# Patient Record
Sex: Male | Born: 1995 | Race: White | Hispanic: No | Marital: Single | Smoking: Former smoker
Health system: Southern US, Community
[De-identification: ages and names within clinical notes are randomized; demographics above are authoritative.]

## PROBLEM LIST (undated history)

## (undated) DIAGNOSIS — E669 Obesity, unspecified: Secondary | ICD-10-CM

## (undated) DIAGNOSIS — F419 Anxiety disorder, unspecified: Secondary | ICD-10-CM

## (undated) DIAGNOSIS — F1921 Other psychoactive substance dependence, in remission: Secondary | ICD-10-CM

## (undated) DIAGNOSIS — F32A Depression, unspecified: Secondary | ICD-10-CM

## (undated) DIAGNOSIS — F329 Major depressive disorder, single episode, unspecified: Secondary | ICD-10-CM

## (undated) DIAGNOSIS — G610 Guillain-Barre syndrome: Secondary | ICD-10-CM

## (undated) DIAGNOSIS — R002 Palpitations: Secondary | ICD-10-CM

## (undated) HISTORY — DX: Palpitations: R00.2

## (undated) HISTORY — DX: Depression, unspecified: F32.A

## (undated) HISTORY — DX: Anxiety disorder, unspecified: F41.9

## (undated) HISTORY — PX: HAND SURGERY: SHX662

## (undated) HISTORY — DX: Obesity, unspecified: E66.9

## (undated) HISTORY — DX: Major depressive disorder, single episode, unspecified: F32.9

## (undated) HISTORY — DX: Other psychoactive substance dependence, in remission: F19.21

## (undated) HISTORY — DX: Guillain-Barre syndrome: G61.0

---
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
## 2014-09-22 ENCOUNTER — Other Ambulatory Visit: Payer: Self-pay | Admitting: Family Medicine

## 2014-09-22 DIAGNOSIS — F32A Depression, unspecified: Secondary | ICD-10-CM

## 2014-09-22 DIAGNOSIS — F329 Major depressive disorder, single episode, unspecified: Secondary | ICD-10-CM

## 2014-09-22 DIAGNOSIS — F419 Anxiety disorder, unspecified: Principal | ICD-10-CM

## 2014-09-22 NOTE — Telephone Encounter (Signed)
Refill request was sent to Dr. Edwena Felty for approval and submission.  Propranolol HCl , 1 (one) Tablet two times daily, #60, 30 days starting 07/14/2014, Ref. x3. Active. Associated Diagnosis:Anxiety and depression (300.00, 311  F41.9, F32.9)  Recorded 07/14/2014 04:20 PM by Edwena Felty, MD, Office Visit.  ePrescribed to [phone], 07/14/2014 4:20 PM [id number], [address] [place], [place] [address] [phone]

## 2014-09-22 NOTE — Telephone Encounter (Signed)
Requesting a refill on Propranolol and is requesting a dosage increase. He is currently taking  twice daily and would like to change it to  3 times daily. Requesting this because he has been over taking it, not all the time just sometimes. [facility]ses [facility]. He has only 2pills left. He took one today and will save the other 2 for tomorrow

## 2014-09-23 MED ORDER — PROPRANOLOL HCL 20 MG PO TABS: 20.0000 mg | ORAL_TABLET | Freq: Three times a day (TID) | ORAL | Status: DC

## 2014-11-10 ENCOUNTER — Other Ambulatory Visit: Payer: Self-pay | Admitting: Family Medicine

## 2014-11-24 ENCOUNTER — Other Ambulatory Visit: Payer: Self-pay | Admitting: Family Medicine

## 2015-03-22 ENCOUNTER — Encounter: Payer: Self-pay | Admitting: Family Medicine

## 2015-03-22 ENCOUNTER — Ambulatory Visit
Admission: RE | Admit: 2015-03-22 | Discharge: 2015-03-22 | Disposition: A | Discharge status: Home / Self Care | Payer: BC Managed Care – PPO | Source: Ambulatory Visit | Attending: Family Medicine | Admitting: Family Medicine

## 2015-03-22 ENCOUNTER — Ambulatory Visit (INDEPENDENT_AMBULATORY_CARE_PROVIDER_SITE_OTHER): Payer: BC Managed Care – PPO | Admitting: Family Medicine

## 2015-03-22 VITALS — BP 118/78 | HR 58 | Temp 98.7°F | Resp 16 | Ht 70.0 in | Wt 212.5 lb

## 2015-03-22 DIAGNOSIS — R112 Nausea with vomiting, unspecified: Secondary | ICD-10-CM

## 2015-03-22 DIAGNOSIS — R197 Diarrhea, unspecified: Secondary | ICD-10-CM

## 2015-03-22 DIAGNOSIS — K5900 Constipation, unspecified: Secondary | ICD-10-CM | POA: Diagnosis not present

## 2015-03-22 DIAGNOSIS — Z113 Encounter for screening for infections with a predominantly sexual mode of transmission: Secondary | ICD-10-CM | POA: Diagnosis not present

## 2015-03-22 DIAGNOSIS — R1084 Generalized abdominal pain: Secondary | ICD-10-CM | POA: Diagnosis not present

## 2015-03-22 MED ORDER — ONDANSETRON 8 MG PO TBDP: 8.0000 mg | ORAL_TABLET | Freq: Three times a day (TID) | ORAL | Status: DC | PRN

## 2015-03-22 MED ORDER — OMEPRAZOLE 40 MG PO CPDR: 40.0000 mg | DELAYED_RELEASE_CAPSULE | Freq: Every day | ORAL | Status: DC

## 2015-03-22 NOTE — Patient Instructions (Signed)
1) Call Dr. Sherley Bounds if not on the way to getting better in 2 weeks then she will order stool tests.

## 2015-03-22 NOTE — Progress Notes (Signed)
Name: Jacob Peters   MRN: [id number]    DOB: 1995-10-25   Date:03/22/2015       Progress Note  Subjective  Chief Complaint  Chief Complaint  Patient presents with  . Abdominal Pain    onset 2 weeks, loss of appetite, nausea, 1-2 episodes of vomiting and dirrhea    HPI  Jacob Peters is a 20 year old male here today with complaints of 2 weeks of generalized vague abdominal discomfort, nausea, vomiting, diarrhea, loss of appetite. No fevers, chills, rash, blood with stools. Denies recent foreign travel. Does eat out however no one else in his household has similar symptoms. He notes that the diarrhea is improved.   Patient Active Problem List   Diagnosis Date Noted  . Awareness of heartbeats 06/09/2013  . Anxiety 03/26/2013  . Obsessional thoughts 03/26/2013  . Episodic paroxysmal anxiety disorder 03/26/2013    Social History  Substance Use Topics  . Smoking status: Current Every Day Smoker  . Smokeless tobacco: Never Used  . Alcohol Use: No     Current outpatient prescriptions:  .  escitalopram (LEXAPRO) 10 MG tablet, TAKE ONE TABLET BY MOUTH ONCE DAILY, Disp: 30 tablet, Rfl: 5 .  propranolol (INDERAL) 20 MG tablet, Take 1 tablet (20 mg total) by mouth 3 (three) times daily., Disp: 90 tablet, Rfl: 5  No Known Allergies  Review of Systems  Positive for abdominal pain, nausea, vomiting, diarrha as mentioned in HPI, otherwise all systems reviewed and are negative.  Objective  BP 118/78 mmHg  Pulse 58  Temp(Src) 98.7 F (37.1 C) (Oral)  Resp 16  Ht  (1.778 m)  Wt 212 lb 8 oz (96.389 kg)  BMI 30.49 kg/m2  SpO2 98%  Body mass index is 30.49 kg/(m^2).   Physical Exam  Constitutional: Patient appears well-developed and well-nourished. In no distress.  Cardiovascular: Normal rate, regular rhythm and normal heart sounds.  No murmur heard.  Pulmonary/Chest: Effort normal and breath sounds normal. No respiratory distress. Abdomen: Soft, non distended, no guarding  or rebound. Mildly increased bowel sounds generalized. No HSM.  Musculoskeletal: Normal range of motion bilateral UE and LE, no joint effusions. Peripheral vascular: Bilateral LE no edema. Neurological: CN II-XII grossly intact with no focal deficits. Alert and oriented to person, place, and time. Coordination, balance, strength, speech and gait are normal.  Skin: Skin is warm and dry. No rash noted. No erythema.  Psychiatric: Patient has a normal mood and affect. Behavior is normal in office today. Judgment and thought content normal in office today.    Assessment & Plan  1. Generalized abdominal pain Likely viral gastroenteritis although symptoms lasting 2 weeks now. I will get abdominal x-ray and place Pride on PPI for [redacted] weeks along with prn nausea medications. Instructed on modified bland diet advance as tolerated.   - CBC with Differential/Platelet - Comprehensive metabolic panel - Hepatitis A antibody, total - Hepatitis B surface antibody - Hepatitis C antibody - HIV antibody - Amylase - Lipase - DG Abd 2 Views; Future - ondansetron (ZOFRAN-ODT) 8 MG disintegrating tablet; Take 1 tablet (8 mg total) by mouth every 8 (eight) hours as needed for nausea or vomiting.  Dispense: 30 tablet; Refill: 1 - omeprazole (PRILOSEC) 40 MG capsule; Take 1 capsule (40 mg total) by mouth daily.  Dispense: 14 capsule; Refill: 0  2. Nausea vomiting and diarrhea In symptoms persist another 2 weeks   - CBC with Differential/Platelet - Comprehensive metabolic panel - Hepatitis A antibody, total -  Hepatitis B surface antibody - Hepatitis C antibody - HIV antibody - Amylase - Lipase - DG Abd 2 Views; Future - ondansetron (ZOFRAN-ODT) 8 MG disintegrating tablet; Take 1 tablet (8 mg total) by mouth every 8 (eight) hours as needed for nausea or vomiting.  Dispense: 30 tablet; Refill: 1 - omeprazole (PRILOSEC) 40 MG capsule; Take 1 capsule (40 mg total) by mouth daily.  Dispense: 14 capsule; Refill:  0  3. Screening for STD (sexually transmitted disease) Viral hepatitis possibility.  - CBC with Differential/Platelet - Comprehensive metabolic panel - Hepatitis A antibody, total - Hepatitis B surface antibody - Hepatitis C antibody - HIV antibody - Amylase - Lipase - DG Abd 2 Views; Future

## 2015-03-23 LAB — CBC WITH DIFFERENTIAL/PLATELET
Basophils Absolute: 0 10*3/uL (ref 0.0–0.2)
Basos: 0 %
EOS (ABSOLUTE): 0.1 10*3/uL (ref 0.0–0.4)
Eos: 2 %
HEMATOCRIT: 44.5 % (ref 37.5–51.0)
Hemoglobin: 15.6 g/dL (ref 12.6–17.7)
IMMATURE GRANULOCYTES: 0 %
Immature Grans (Abs): 0 10*3/uL (ref 0.0–0.1)
LYMPHS: 45 %
Lymphocytes Absolute: 2.7 10*3/uL (ref 0.7–3.1)
MCH: 28.9 pg (ref 26.6–33.0)
MCHC: 35.1 g/dL (ref 31.5–35.7)
MCV: 82 fL (ref 79–97)
MONOCYTES: 9 %
Monocytes Absolute: 0.5 10*3/uL (ref 0.1–0.9)
NEUTROS ABS: 2.7 10*3/uL (ref 1.4–7.0)
NEUTROS PCT: 44 %
PLATELETS: 344 10*3/uL (ref 150–379)
RBC: 5.4 x10E6/uL (ref 4.14–5.80)
RDW: 13.3 % (ref 12.3–15.4)
WBC: 6.1 10*3/uL (ref 3.4–10.8)

## 2015-03-23 LAB — COMPREHENSIVE METABOLIC PANEL
A/G RATIO: 2.1 (ref 1.1–2.5)
ALBUMIN: 4.5 g/dL (ref 3.5–5.5)
ALT: 19 IU/L (ref 0–44)
AST: 20 IU/L (ref 0–40)
Alkaline Phosphatase: 66 IU/L (ref 39–117)
BUN / CREAT RATIO: 7 — AB (ref 8–19)
BUN: 6 mg/dL (ref 6–20)
Bilirubin Total: 0.7 mg/dL (ref 0.0–1.2)
CO2: 28 mmol/L (ref 18–29)
Calcium: 9.4 mg/dL (ref 8.7–10.2)
Chloride: 105 mmol/L (ref 96–106)
Creatinine, Ser: 0.92 mg/dL (ref 0.76–1.27)
GFR, EST AFRICAN AMERICAN: 138 mL/min/{1.73_m2} (ref >59)
GFR, EST NON AFRICAN AMERICAN: 119 mL/min/{1.73_m2} (ref >59)
Globulin, Total: 2.1 g/dL (ref 1.5–4.5)
Glucose: 83 mg/dL (ref 65–99)
POTASSIUM: 4.4 mmol/L (ref 3.5–5.2)
Sodium: 145 mmol/L — ABNORMAL HIGH (ref 134–144)
TOTAL PROTEIN: 6.6 g/dL (ref 6.0–8.5)

## 2015-03-23 LAB — HEPATITIS A ANTIBODY, TOTAL: HEP A TOTAL AB: NEGATIVE

## 2015-03-23 LAB — HEPATITIS B SURFACE ANTIBODY,QUALITATIVE: Hep B Surface Ab, Qual: REACTIVE

## 2015-03-23 LAB — LIPASE: LIPASE: 23 U/L (ref 0–59)

## 2015-03-23 LAB — AMYLASE: Amylase: 40 U/L (ref 31–124)

## 2015-03-23 LAB — HIV ANTIBODY (ROUTINE TESTING W REFLEX): HIV SCREEN 4TH GENERATION: NONREACTIVE

## 2015-03-23 LAB — HEPATITIS C ANTIBODY

## 2015-04-09 ENCOUNTER — Other Ambulatory Visit: Payer: Self-pay | Admitting: Family Medicine

## 2015-06-08 ENCOUNTER — Other Ambulatory Visit: Payer: Self-pay

## 2015-06-08 MED ORDER — ESCITALOPRAM OXALATE 10 MG PO TABS: 10.0000 mg | ORAL_TABLET | Freq: Every day | ORAL | Status: DC

## 2015-06-08 NOTE — Telephone Encounter (Signed)
Last rx reviewed; rx filled

## 2015-08-15 ENCOUNTER — Other Ambulatory Visit: Payer: Self-pay | Admitting: Family Medicine

## 2015-09-14 ENCOUNTER — Other Ambulatory Visit: Payer: Self-pay | Admitting: Family Medicine

## 2015-09-14 NOTE — Telephone Encounter (Signed)
I approved one month of medicine in June with note asking for appointment please; I do not see any appointment scheduled I'll approve one week while we contact patient and get him scheduled to be seen

## 2015-09-15 NOTE — Telephone Encounter (Signed)
LM for pt to call office to schedule an appt

## 2015-09-15 NOTE — Telephone Encounter (Signed)
L/M for pt to call the office to schedule an appt

## 2015-09-16 ENCOUNTER — Other Ambulatory Visit: Payer: Self-pay

## 2015-09-16 MED ORDER — PROPRANOLOL HCL 20 MG PO TABS: 20.0000 mg | ORAL_TABLET | Freq: Three times a day (TID) | ORAL | Status: DC

## 2015-09-16 NOTE — Telephone Encounter (Signed)
Limited Rx sent; last pulse was less than 60; may need to adjust medicine; he has appt this week and will discuss then

## 2015-09-17 ENCOUNTER — Encounter: Payer: Self-pay | Admitting: Family Medicine

## 2015-09-17 ENCOUNTER — Ambulatory Visit (INDEPENDENT_AMBULATORY_CARE_PROVIDER_SITE_OTHER): Payer: BC Managed Care – PPO | Admitting: Family Medicine

## 2015-09-17 VITALS — BP 122/84 | HR 87 | Temp 97.8°F | Resp 14 | Wt 229.0 lb

## 2015-09-17 DIAGNOSIS — M545 Low back pain, unspecified: Secondary | ICD-10-CM

## 2015-09-17 DIAGNOSIS — E669 Obesity, unspecified: Secondary | ICD-10-CM

## 2015-09-17 DIAGNOSIS — F419 Anxiety disorder, unspecified: Secondary | ICD-10-CM

## 2015-09-17 DIAGNOSIS — R635 Abnormal weight gain: Secondary | ICD-10-CM

## 2015-09-17 DIAGNOSIS — F1921 Other psychoactive substance dependence, in remission: Secondary | ICD-10-CM | POA: Diagnosis not present

## 2015-09-17 DIAGNOSIS — Z0000 Encounter for general adult medical examination without abnormal findings: Secondary | ICD-10-CM

## 2015-09-17 DIAGNOSIS — M79672 Pain in left foot: Secondary | ICD-10-CM | POA: Diagnosis not present

## 2015-09-17 DIAGNOSIS — R002 Palpitations: Secondary | ICD-10-CM

## 2015-09-17 HISTORY — DX: Obesity, unspecified: E66.9

## 2015-09-17 HISTORY — DX: Other psychoactive substance dependence, in remission: F19.21

## 2015-09-17 LAB — CBC WITH DIFFERENTIAL/PLATELET
BASOS PCT: 0 %
Basophils Absolute: 0 cells/uL (ref 0–200)
EOS PCT: 2 %
Eosinophils Absolute: 160 cells/uL (ref 15–500)
HCT: 47 % (ref 38.5–50.0)
HEMOGLOBIN: 16.2 g/dL (ref 13.2–17.1)
LYMPHS ABS: 3040 {cells}/uL (ref 850–3900)
Lymphocytes Relative: 38 %
MCH: 29.1 pg (ref 27.0–33.0)
MCHC: 34.5 g/dL (ref 32.0–36.0)
MCV: 84.5 fL (ref 80.0–100.0)
MPV: 9.7 fL (ref 7.5–12.5)
Monocytes Absolute: 880 cells/uL (ref 200–950)
Monocytes Relative: 11 %
NEUTROS ABS: 3920 {cells}/uL (ref 1500–7800)
Neutrophils Relative %: 49 %
Platelets: 291 10*3/uL (ref 140–400)
RBC: 5.56 MIL/uL (ref 4.20–5.80)
RDW: 13.3 % (ref 11.0–15.0)
WBC: 8 10*3/uL (ref 3.8–10.8)

## 2015-09-17 LAB — TSH: TSH: 1.26 m[IU]/L (ref 0.40–4.50)

## 2015-09-17 LAB — T4, FREE: FREE T4: 1 ng/dL (ref 0.8–1.8)

## 2015-09-17 MED ORDER — PROPRANOLOL HCL 20 MG PO TABS: 20.0000 mg | ORAL_TABLET | Freq: Three times a day (TID) | ORAL | Status: DC

## 2015-09-17 MED ORDER — MELOXICAM 7.5 MG PO TABS: 7.5000 mg | ORAL_TABLET | Freq: Every day | ORAL | Status: DC

## 2015-09-17 MED ORDER — ESCITALOPRAM OXALATE 10 MG PO TABS: 10.0000 mg | ORAL_TABLET | Freq: Every day | ORAL | Status: DC

## 2015-09-17 NOTE — Assessment & Plan Note (Signed)
Will avoid benzos given his history of addiction; so glad he felt open to share that with me; will use the SSRI and beta-blocker to treat his symptoms

## 2015-09-17 NOTE — Assessment & Plan Note (Signed)
Will use the meloxicam for this as well; will spare him the radiation of an xray today; may set up with arthritis in the future though; try turmeric as natural anti-inflammatory

## 2015-09-17 NOTE — Assessment & Plan Note (Signed)
Patient has put on significant weight over the last few years; check TSH and free T4; see AVS for recommendations for weight loss; encouraged him to not skip meals and to avoid empty calories

## 2015-09-17 NOTE — Progress Notes (Signed)
BP 122/84 mmHg  Pulse 87  Temp(Src) 97.8 F (36.6 C) (Oral)  Resp 14  Wt 229 lb (103.874 kg)  SpO2 97%   Subjective:    Patient ID: Jacob Peters, male    DOB: 02/05/1996, 20 y.o.   MRN: [id number]  HPI: Jacob Peters is a 20 y.o. male  Chief Complaint  Patient presents with  . Medication Refill  . Back Pain    lower back   He has anxiety; has been on beta-blocker and SSRI for a few years; used to take clonazepam but had a problem with that in college, was drinking and taking benzo; 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 and is now off of those and does not drink any alcohol He focuses on his heart rate; can go up and down; it has been checked out; has palpitations too; inderal controls that pretty well; resting heart around 60; inderal really helps it; does make him a little tired and groggy  He hurt his back on the right side; thinks his shoes aren't really great; was pushing 150 pounds the other day, now hurting in the lower back at the very bottom; also running up the right side too; pretty significant pain; laying down feels the best; still has dull pain; hurts with twisting; tried ibuprofen and icy hot an ice and heat; no weakness or numbness going down the leg; had back problems through middle school, occasional soreness with sports; once had a upper back issue with sports in middle school, got better with exercises No B/B dysfunction  He hurt his 1st MTP left foot 3 months ago; swelled up, pain with walking; pain better, but still with limited ROM and swelling medially  Weighed 165 pounds as a [employer]; no thyroid disease; gaining weight; 17 pounds up since January, 60+ pounds over last 3-4 years; no alcohol calories, but drinking sodas; work, waiting tables, up and moving at work, but not very active when gets home  Depression screen [facility] 2/9 09/17/2015 03/22/2015  Decreased Interest 0 0  Down, Depressed, Hopeless 0 1  PHQ - 2 Score 0 1   Relevant past medical, surgical, family and social  history reviewed Past Medical History  Diagnosis Date  . Palpitations   . Anxiety   . 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 Obesity 09/17/2015   History reviewed. No pertinent past surgical history.   Family History  Problem Relation Age of Onset  . Anxiety disorder Mother   . Hypertension Father    Social History  Substance Use Topics  . Smoking status: Current Every Day Smoker  . Smokeless tobacco: Never Used  . Alcohol Use: No   Interim medical history since last visit reviewed. Allergies and medications reviewed  Review of Systems Per HPI unless specifically indicated above     Objective:    BP 122/84 mmHg  Pulse 87  Temp(Src) 97.8 F (36.6 C) (Oral)  Resp 14  Wt 229 lb (103.874 kg)  SpO2 97%  Wt Readings from Last 3 Encounters:  09/17/15 229 lb (103.874 kg)  03/22/15 212 lb 8 oz (96.389 kg)   body mass index is 32.86 kg/(m^2).  Physical Exam  Constitutional: He appears well-developed and well-nourished. No distress.  Weight gain 16-1/2 pounds over last 6 months  HENT:  Head: Normocephalic and atraumatic.  Eyes: EOM are normal. No scleral icterus.  Neck: No thyromegaly present.  Cardiovascular:  Normal rate, regular rhythm and normal heart sounds.   No murmur heard. Pulmonary/Chest: Effort normal and breath sounds normal.  Abdominal: Soft. Bowel sounds are normal. He exhibits no distension.  Musculoskeletal: He exhibits no edema.       Lumbar back: He exhibits decreased range of motion (forward flexion limited to about 60 degrees; extension at waist normal; rotation causes right sided lower back pain). He exhibits no swelling, no edema (mild right sided lumbar edema, no erythema) and no spasm.       Left foot: There is decreased range of motion (left 1st MTP) and swelling (medial aspect 1st MTP). There is no crepitus and no deformity.  Neurological:  Coordination normal.  Skin: Skin is warm and dry. No pallor.  Psychiatric: His behavior is normal. Judgment and thought content normal. His mood appears anxious (mildly anxious). His affect is not blunt, not labile and not inappropriate. His speech is not rapid and/or pressured. He is not hyperactive. Cognition and memory are not impaired. He does not exhibit a depressed mood. He is attentive.   Results for orders placed or performed in visit on 03/22/15  CBC with Differential/Platelet  Result Value Ref Range   WBC 6.1 3.4 - 10.8 x10E3/uL   RBC 5.40 4.14 - 5.80 x10E6/uL   Hemoglobin 15.6 12.6 - 17.7 g/dL   Hematocrit 47.844.5 29.537.5 - 51.0 %   MCV 82 79 - 97 fL   MCH 28.9 26.6 - 33.0 pg   MCHC 35.1 31.5 - 35.7 g/dL   RDW 62.113.3 30.812.3 - 65.715.4 %   Platelets 344 150 - 379 x10E3/uL   Neutrophils 44 %   Lymphs 45 %   Monocytes 9 %   Eos 2 %   Basos 0 %   Neutrophils Absolute 2.7 1.4 - 7.0 x10E3/uL   Lymphocytes Absolute 2.7 0.7 - 3.1 x10E3/uL   Monocytes Absolute 0.5 0.1 - 0.9 x10E3/uL   EOS (ABSOLUTE) 0.1 0.0 - 0.4 x10E3/uL   Basophils Absolute 0.0 0.0 - 0.2 x10E3/uL   Immature Granulocytes 0 %   Immature Grans (Abs) 0.0 0.0 - 0.1 x10E3/uL  Comprehensive metabolic panel  Result Value Ref Range   Glucose 83 65 - 99 mg/dL   BUN 6 6 - 20 mg/dL   Creatinine, Ser 8.460.92 0.76 - 1.27 mg/dL   GFR calc non Af Amer 119 >59 mL/min/1.73   GFR calc Af Amer 138 >59 mL/min/1.73   BUN/Creatinine Ratio 7 (L) 8 - 19   Sodium 145 (H) 134 - 144 mmol/L   Potassium 4.4 3.5 - 5.2 mmol/L   Chloride 105 96 - 106 mmol/L   CO2 28 18 - 29 mmol/L   Calcium 9.4 8.7 - 10.2 mg/dL   Total Protein 6.6 6.0 - 8.5 g/dL   Albumin 4.5 3.5 - 5.5 g/dL   Globulin, Total 2.1 1.5 - 4.5 g/dL   Albumin/Globulin Ratio 2.1 1.1 - 2.5   Bilirubin Total 0.7 0.0 - 1.2 mg/dL   Alkaline Phosphatase 66 39 - 117 IU/L   AST 20 0 - 40 IU/L   ALT 19 0 - 44 IU/L  Hepatitis A antibody, total  Result Value Ref Range   Hep A Total Ab Negative  Negative  Hepatitis B surface antibody  Result Value Ref Range   Hep B Surface Ab, Qual Reactive   Hepatitis C antibody  Result Value Ref Range   Hep C Virus Ab <0.1 0.0 - 0.9 s/co ratio  HIV antibody  Result Value Ref Range  HIV Screen 4th Generation wRfx Non Reactive Non Reactive  Amylase  Result Value Ref Range   Amylase 40 31 - 124 U/L  Lipase  Result Value Ref Range   Lipase 23 0 - 59 U/L      Assessment & Plan:   Problem List Items Addressed This Visit      Other   Weight gain, abnormal    Patient has put on significant weight over the last few years; check TSH and free T4; see AVS for recommendations for weight loss; encouraged him to not skip meals and to avoid empty calories      Relevant Orders   TSH   T4, free   Preventative health care    Will check screening lipids (nonfasting), cbc, cmet      Relevant Orders   CBC with Differential/Platelet   Comprehensive metabolic panel   Lipid panel   Obesity    With abnormal weight gain; see AVS; encouraged not skipping meals; avoiding soft drinks, check TSH to see if thyroid issue      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)    Hx of benzo addiction, combined with alcohol; 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; now in remission; so glad patient shared that with me; I will not give him anything that could set off his addiction such as narcotic pain medicine, benzo, or muscle relaxant      Foot pain, left    Will use the meloxicam for this as well; will spare him the radiation of an xray today; may set up with arthritis in the future though; try turmeric as natural anti-inflammatory      Awareness of heartbeats    Patient reports heart has been checked out; will continue the propranolol as he reports this helps with his symptoms; will also check K+      Anxiety    Will avoid benzos given his history of addiction; so glad he felt open to share that with me; will use the SSRI and beta-blocker to treat his symptoms      Relevant  Medications   escitalopram (LEXAPRO) 10 MG tablet    Other Visit Diagnoses    Right-sided low back pain without sciatica    -  Primary    Relevant Medications    meloxicam (MOBIC) 7.5 MG tablet        Follow up plan: Return in about 1 year (around 09/16/2016) for follow-up.  An after-visit summary was printed and given to the patient at check-out.  Please see the patient instructions which may contain other information and recommendations beyond what is mentioned above in the assessment and plan.  Meds ordered this encounter  Medications  . propranolol (INDERAL) 20 MG tablet    Sig: Take 1 tablet (20 mg total) by mouth 3 (three) times daily.    Dispense:  90 tablet    Refill:  11  . escitalopram (LEXAPRO) 10 MG tablet    Sig: Take 1 tablet (10 mg total) by mouth daily.    Dispense:  30 tablet    Refill:  11  . meloxicam (MOBIC) 7.5 MG tablet    Sig: Take 1 tablet (7.5 mg total) by mouth daily. If needed for back pain; do not take other NSAIDs; take with food    Dispense:  30 tablet    Refill:  0    Orders Placed This Encounter  Procedures  . CBC with Differential/Platelet  . Comprehensive metabolic panel  . TSH  . T4, free  .  Lipid panel

## 2015-09-17 NOTE — Assessment & Plan Note (Signed)
With abnormal weight gain; see AVS; encouraged not skipping meals; avoiding soft drinks, check TSH to see if thyroid issue

## 2015-09-17 NOTE — Assessment & Plan Note (Signed)
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

## 2015-09-17 NOTE — Patient Instructions (Addendum)
Try turmeric as a natural anti-inflammatory (for pain and arthritis). It comes in capsules where you buy aspirin and fish oil, but also as a spice where you buy pepper and garlic powder.  Start meloxicam and take one a day if needed for back and/or foot pain and swelling Do not use other NSAIDs while on the meloxicam You can use generic Aleve 220 mg AFTER you finish the meloxicam Okay to use tylenol per package directions with either meloxicam or Aleve  We'll get labs today If you have not heard anything from my staff in a week about any orders/referrals/studies from today, please contact us here to follow-up [phone]  Check out the information at [facility]org entitled "Nutrition for Weight Loss: What You Need to Know about Fad Diets" Try to lose between 1-2 pounds per week by taking in fewer calories and burning off more calories You can succeed by limiting portions, limiting foods dense in calories and fat, becoming more active, and drinking 8 glasses of water a day (64 ounces) Don't skip meals, especially breakfast, as skipping meals may alter your metabolism Do not use over-the-counter weight loss pills or gimmicks that claim rapid weight loss A healthy BMI (or body mass index) is between 18.5 and 24.9 You can calculate your ideal BMI at the NIH website [employer]  .Back Pain, Adult Back pain is very common in adults.The cause of back pain is rarely dangerous and the pain often gets better over time.The cause of your back pain may not be known. Some common causes of back pain include: 1. Strain of the muscles or ligaments supporting the spine. 2. Wear and tear (degeneration) of the spinal disks. 3. Arthritis. 4. Direct injury to the back. For many people, back pain may return. Since back pain is rarely dangerous, most people can learn to manage this condition on their own. HOME CARE INSTRUCTIONS Watch your back pain  for any changes. The following actions may help to lessen any discomfort you are feeling: 1. Remain active. It is stressful on your back to sit or stand in one place for long periods of time. Do not sit, drive, or stand in one place for more than 30 minutes at a time. Take short walks on even surfaces as soon as you are able.Try to increase the length of time you walk each day. 2. Exercise regularly as directed by your health care provider. Exercise helps your back heal faster. It also helps avoid future injury by keeping your muscles strong and flexible. 3. Do not stay in bed.Resting more than 1-2 days can delay your recovery. 4. Pay attention to your body when you bend and lift. The most comfortable positions are those that put less stress on your recovering back. Always use proper lifting techniques, including: 1. Bending your knees. 2. Keeping the load close to your body. 3. Avoiding twisting. 5. Find a comfortable position to sleep. Use a firm mattress and lie on your side with your knees slightly bent. If you lie on your back, put a pillow under your knees. 6. Avoid feeling anxious or stressed.Stress increases muscle tension and can worsen back pain.It is important to recognize when you are anxious or stressed and learn ways to manage it, such as with exercise. 7. Take medicines only as directed by your health care provider. Over-the-counter medicines to reduce pain and inflammation are often the most helpful.Your health care provider may prescribe muscle relaxant drugs.These medicines help dull your pain so you can more quickly return  to your normal activities and healthy exercise. 8. Apply ice to the injured area: 1. Put ice in a plastic bag. 2. Place a towel between your skin and the bag. 3. Leave the ice on for 20 minutes, 2-3 times a day for the first 2-3 days. After that, ice and heat may be alternated to reduce pain and spasms. 9. Maintain a healthy weight. Excess weight puts extra  stress on your back and makes it difficult to maintain good posture. SEEK MEDICAL CARE IF: 1. You have pain that is not relieved with rest or medicine. 2. You have increasing pain going down into the legs or buttocks. 3. You have pain that does not improve in one week. 4. You have night pain. 5. You lose weight. 6. You have a fever or chills. SEEK IMMEDIATE MEDICAL CARE IF:  1. You develop new bowel or bladder control problems. 2. You have unusual weakness or numbness in your arms or legs. 3. You develop nausea or vomiting. 4. You develop abdominal pain. 5. You feel faint.   This information is not intended to replace advice given to you by your health care provider. Make sure you discuss any questions you have with your health care provider.   Document Released: 02/13/2005 Document Revised: 03/06/2014 Document Reviewed: 06/17/2013 Elsevier Interactive Patient Education 2016 [employer] Back Exercises The following exercises strengthen the muscles that help to support the back. They also help to keep the lower back flexible. Doing these exercises can help to prevent back pain or lessen existing pain. If you have back pain or discomfort, try doing these exercises 2-3 times each day or as told by your health care provider. When the pain goes away, do them once each day, but increase the number of times that you repeat the steps for each exercise (do more repetitions). If you do not have back pain or discomfort, do these exercises once each day or as told by your health care provider. EXERCISES Single Knee to Chest Repeat these steps 3-5 times for each leg: 5. Lie on your back on a firm bed or the floor with your legs extended. 6. Bring one knee to your chest. Your other leg should stay extended and in contact with the floor. 7. Hold your knee in place by grabbing your knee or thigh. 8. Pull on your knee until you feel a gentle stretch in your lower back. 9. Hold the stretch for 10-30  seconds. 10. Slowly release and straighten your leg. Pelvic Tilt Repeat these steps 5-10 times: 10. Lie on your back on a firm bed or the floor with your legs extended. 11. Bend your knees so they are pointing toward the ceiling and your feet are flat on the floor. 12. Tighten your lower abdominal muscles to press your lower back against the floor. This motion will tilt your pelvis so your tailbone points up toward the ceiling instead of pointing to your feet or the floor. 13. With gentle tension and even breathing, hold this position for 5-10 seconds. Cat-Cow Repeat these steps until your lower back becomes more flexible: 7. Get into a hands-and-knees position on a firm surface. Keep your hands under your shoulders, and keep your knees under your hips. You may place padding under your knees for comfort. 8. Let your head hang down, and point your tailbone toward the floor so your lower back becomes rounded like the back of a cat. 9. Hold this position for 5 seconds. 10. Slowly lift your head  and point your tailbone up toward the ceiling so your back forms a sagging arch like the back of a cow. 11. Hold this position for 5 seconds. Press-Ups Repeat these steps 5-10 times: 6. Lie on your abdomen (face-down) on the floor. 7. Place your palms near your head, about shoulder-width apart. 8. While you keep your back as relaxed as possible and keep your hips on the floor, slowly straighten your arms to raise the top half of your body and lift your shoulders. Do not use your back muscles to raise your upper torso. You may adjust the placement of your hands to make yourself more comfortable. 9. Hold this position for 5 seconds while you keep your back relaxed. 10. Slowly return to lying flat on the floor. Bridges Repeat these steps 10 times: 1. Lie on your back on a firm surface. 2. Bend your knees so they are pointing toward the ceiling and your feet are flat on the floor. 3. Tighten your buttocks  muscles and lift your buttocks off of the floor until your waist is at almost the same height as your knees. You should feel the muscles working in your buttocks and the back of your thighs. If you do not feel these muscles, slide your feet 1-2 inches farther away from your buttocks. 4. Hold this position for 3-5 seconds. 5. Slowly lower your hips to the starting position, and allow your buttocks muscles to relax completely. If this exercise is too easy, try doing it with your arms crossed over your chest. Abdominal Crunches Repeat these steps 5-10 times: 1. Lie on your back on a firm bed or the floor with your legs extended. 2. Bend your knees so they are pointing toward the ceiling and your feet are flat on the floor. 3. Cross your arms over your chest. 4. Tip your chin slightly toward your chest without bending your neck. 5. Tighten your abdominal muscles and slowly raise your trunk (torso) high enough to lift your shoulder blades a tiny bit off of the floor. Avoid raising your torso higher than that, because it can put too much stress on your low back and it does not help to strengthen your abdominal muscles. 6. Slowly return to your starting position. Back Lifts Repeat these steps 5-10 times: 1. Lie on your abdomen (face-down) with your arms at your sides, and rest your forehead on the floor. 2. Tighten the muscles in your legs and your buttocks. 3. Slowly lift your chest off of the floor while you keep your hips pressed to the floor. Keep the back of your head in line with the curve in your back. Your eyes should be looking at the floor. 4. Hold this position for 3-5 seconds. 5. Slowly return to your starting position. SEEK MEDICAL CARE IF:  Your back pain or discomfort gets much worse when you do an exercise.  Your back pain or discomfort does not lessen within 2 hours after you exercise. If you have any of these problems, stop doing these exercises right away. Do not do them again  unless your health care provider says that you can. SEEK IMMEDIATE MEDICAL CARE IF:  You develop sudden, severe back pain. If this happens, stop doing the exercises right away. Do not do them again unless your health care provider says that you can.   This information is not intended to replace advice given to you by your health care provider. Make sure you discuss any questions you have with your health  care provider.   Document Released: 03/23/2004 Document Revised: 11/04/2014 Document Reviewed: 04/09/2014 Elsevier Interactive Patient Education [employer]

## 2015-09-17 NOTE — Assessment & Plan Note (Signed)
Will check screening lipids (nonfasting), cbc, cmet

## 2015-09-17 NOTE — Assessment & Plan Note (Signed)
Patient reports heart has been checked out; will continue the propranolol as he reports this helps with his symptoms; will also check K+

## 2015-09-18 LAB — LIPID PANEL
CHOL/HDL RATIO: 3.8 ratio (ref <=5.0)
Cholesterol: 150 mg/dL (ref 125–200)
HDL: 40 mg/dL (ref >=40)
LDL CALC: 92 mg/dL (ref <130)
Triglycerides: 91 mg/dL (ref <150)
VLDL: 18 mg/dL (ref <30)

## 2015-09-18 LAB — COMPREHENSIVE METABOLIC PANEL
ALBUMIN: 4.4 g/dL (ref 3.6–5.1)
ALT: 16 U/L (ref 9–46)
AST: 19 U/L (ref 10–40)
Alkaline Phosphatase: 55 U/L (ref 40–115)
BILIRUBIN TOTAL: 0.7 mg/dL (ref 0.2–1.2)
BUN: 13 mg/dL (ref 7–25)
CO2: 27 mmol/L (ref 20–31)
CREATININE: 0.98 mg/dL (ref 0.60–1.35)
Calcium: 9.3 mg/dL (ref 8.6–10.3)
Chloride: 103 mmol/L (ref 98–110)
Glucose, Bld: 81 mg/dL (ref 65–99)
Potassium: 4.3 mmol/L (ref 3.5–5.3)
SODIUM: 141 mmol/L (ref 135–146)
TOTAL PROTEIN: 6.8 g/dL (ref 6.1–8.1)

## 2015-10-28 ENCOUNTER — Encounter: Payer: Self-pay | Admitting: Medical Oncology

## 2015-10-28 ENCOUNTER — Emergency Department
Admission: EM | Admit: 2015-10-28 | Discharge: 2015-10-28 | Disposition: A | Discharge status: Home / Self Care | Payer: BC Managed Care – PPO | Attending: Emergency Medicine | Admitting: Emergency Medicine

## 2015-10-28 DIAGNOSIS — A084 Viral intestinal infection, unspecified: Secondary | ICD-10-CM | POA: Diagnosis not present

## 2015-10-28 DIAGNOSIS — R197 Diarrhea, unspecified: Secondary | ICD-10-CM

## 2015-10-28 DIAGNOSIS — F17200 Nicotine dependence, unspecified, uncomplicated: Secondary | ICD-10-CM | POA: Insufficient documentation

## 2015-10-28 DIAGNOSIS — R112 Nausea with vomiting, unspecified: Secondary | ICD-10-CM

## 2015-10-28 LAB — CBC
HCT: 50.4 % (ref 40.0–52.0)
HEMOGLOBIN: 17.6 g/dL (ref 13.0–18.0)
MCH: 29.6 pg (ref 26.0–34.0)
MCHC: 34.9 g/dL (ref 32.0–36.0)
MCV: 84.7 fL (ref 80.0–100.0)
Platelets: 245 10*3/uL (ref 150–440)
RBC: 5.95 MIL/uL — ABNORMAL HIGH (ref 4.40–5.90)
RDW: 13.5 % (ref 11.5–14.5)
WBC: 13.3 10*3/uL — ABNORMAL HIGH (ref 3.8–10.6)

## 2015-10-28 LAB — URINALYSIS COMPLETE WITH MICROSCOPIC (ARMC ONLY)
BACTERIA UA: NONE SEEN
Bilirubin Urine: NEGATIVE
GLUCOSE, UA: NEGATIVE mg/dL
Hgb urine dipstick: NEGATIVE
KETONES UR: NEGATIVE mg/dL
Leukocytes, UA: NEGATIVE
NITRITE: NEGATIVE
PROTEIN: NEGATIVE mg/dL
Specific Gravity, Urine: 1.021 (ref 1.005–1.030)
pH: 5 (ref 5.0–8.0)

## 2015-10-28 LAB — COMPREHENSIVE METABOLIC PANEL
ALBUMIN: 4.5 g/dL (ref 3.5–5.0)
ALK PHOS: 53 U/L (ref 38–126)
ALT: 18 U/L (ref 17–63)
ANION GAP: 10 (ref 5–15)
AST: 32 U/L (ref 15–41)
BILIRUBIN TOTAL: 1.9 mg/dL — AB (ref 0.3–1.2)
BUN: 13 mg/dL (ref 6–20)
CALCIUM: 9.1 mg/dL (ref 8.9–10.3)
CO2: 22 mmol/L (ref 22–32)
Chloride: 104 mmol/L (ref 101–111)
Creatinine, Ser: 0.95 mg/dL (ref 0.61–1.24)
GFR calc Af Amer: >60 mL/min (ref >60)
GFR calc non Af Amer: >60 mL/min (ref >60)
GLUCOSE: 102 mg/dL — AB (ref 65–99)
Potassium: 3.8 mmol/L (ref 3.5–5.1)
SODIUM: 136 mmol/L (ref 135–145)
TOTAL PROTEIN: 7.5 g/dL (ref 6.5–8.1)

## 2015-10-28 LAB — LIPASE, BLOOD: Lipase: 20 U/L (ref 11–51)

## 2015-10-28 MED ORDER — KETOROLAC TROMETHAMINE 30 MG/ML IJ SOLN
30.0000 mg | Freq: Once | INTRAMUSCULAR | Status: AC
  Administered 2015-10-28: 30 mg via INTRAVENOUS
  Filled 2015-10-28: qty 1

## 2015-10-28 MED ORDER — SODIUM CHLORIDE 0.9 % IV BOLUS (SEPSIS)
1000.0000 mL | Freq: Once | INTRAVENOUS | Status: AC
  Administered 2015-10-28: 1000 mL via INTRAVENOUS

## 2015-10-28 MED ORDER — ACETAMINOPHEN 325 MG PO TABS
650.0000 mg | ORAL_TABLET | Freq: Once | ORAL | Status: AC
  Administered 2015-10-28: 650 mg via ORAL

## 2015-10-28 MED ORDER — ONDANSETRON HCL 4 MG/2ML IJ SOLN
4.0000 mg | Freq: Once | INTRAMUSCULAR | Status: AC
  Administered 2015-10-28: 4 mg via INTRAVENOUS

## 2015-10-28 MED ORDER — ONDANSETRON HCL 4 MG PO TABS: 4.0000 mg | ORAL_TABLET | Freq: Three times a day (TID) | ORAL | 0 refills | Status: AC | PRN

## 2015-10-28 MED ORDER — ONDANSETRON HCL 4 MG/2ML IJ SOLN
INTRAMUSCULAR | Status: AC
  Filled 2015-10-28: qty 2

## 2015-10-28 MED ORDER — ONDANSETRON HCL 4 MG/2ML IJ SOLN
4.0000 mg | Freq: Once | INTRAMUSCULAR | Status: AC
  Administered 2015-10-28: 4 mg via INTRAVENOUS
  Filled 2015-10-28: qty 2

## 2015-10-28 MED ORDER — ACETAMINOPHEN 325 MG PO TABS
ORAL_TABLET | ORAL | Status: AC
  Administered 2015-10-28: 650 mg via ORAL
  Filled 2015-10-28: qty 2

## 2015-10-28 NOTE — Discharge Instructions (Signed)
Bland diet (bananas, rice, apple sauce, toast, mashed potatoes, crackers) and increase as tolerated. Avoid heavy, fried foods.   Keep well hydrated with water, gatorade. Avoid caffeine and sugar.

## 2015-10-28 NOTE — ED Notes (Signed)
Feels a little better  No vomiting or diarrhea noted since arrival

## 2015-10-28 NOTE — ED Provider Notes (Signed)
[facility] Emergency Department Provider Note  ____________________________________________  Time seen: Approximately 10:59 AM  I have reviewed the triage vital signs and the nursing notes.   HISTORY  Chief Complaint Abdominal Pain; Nausea; Emesis; and Diarrhea    HPI Jacob Peters is a 20 y.o. male , NAD, presents to emergency department with several hour history of nausea, vomiting and diarrhea. States that he woke around 1 AM this morning with sudden onset of vomiting and diarrhea. Has had 7 episodes of vomiting and 15 or more episodes of watery diarrhea without foul smell. States he started feeling nauseous and bloated around 8 PM. Notes that bloated feeling has resolved. Has not had any specific abdominal pain but states he feels achy in the stomach due to retching. Has not noted any blood in his stool or emesis. Denies dysuria, hematuria or changes in urinary flow or frequency. States he did not eat anything out of the ordinary yesterday. 8 clear chowder around 4 PM and then [employer] chicken nuggets and fries sometime after 8 PM. States the morning today he has not been able to keep liquids down without vomiting or diarrhea beginning soon after. Denies any sick contacts. Has not had any fevers but has felt chilled at times. Denies any chest pain, shortness of breath, numbness, weakness, tingling. Has had no saddle paresthesias nor loss of bowel or bladder control. Has noted any skin sores or rashes. Denies sore throat. Has not taken anything over-the-counter at this time for his symptoms. No recent antibiotic use.   Past Medical History:  Diagnosis Date  . Anxiety   . 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 Obesity 09/17/2015  . Palpitations     Patient Active Problem List   Diagnosis Date Noted  . Weight gain, abnormal 09/17/2015  . Preventative health care  09/17/2015  . Foot pain, left 09/17/2015  . 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 Obesity 09/17/2015  . Screening for STD (sexually transmitted disease) 03/22/2015  . Awareness of heartbeats 06/09/2013  . Anxiety 03/26/2013    History reviewed. No pertinent surgical history.  Prior to Admission medications   Medication Sig Start Date End Date Taking? Authorizing Provider  escitalopram (LEXAPRO) 10 MG tablet Take 1 tablet (10 mg total) by mouth daily. 09/17/15   Kerman Passey, MD  meloxicam (MOBIC) 7.5 MG tablet Take 1 tablet (7.5 mg total) by mouth daily. If needed for back pain; do not take other NSAIDs; take with food 09/17/15   Kerman Passey, MD  ondansetron (ZOFRAN) 4 MG tablet Take 1 tablet (4 mg total) by mouth every 8 (eight) hours as needed for nausea or vomiting. 10/28/15 11/04/15  Orbie Grupe L Kailo Kosik, PA-C  propranolol (INDERAL) 20 MG tablet Take 1 tablet (20 mg total) by mouth 3 (three) times daily. 09/17/15   Kerman Passey, MD    Allergies Review of patient's allergies indicates no known allergies.  Family History  Problem Relation Age of Onset  . Anxiety disorder Mother   . Hypertension Father     Social History Social History  Substance Use Topics  . Smoking status: Current Every Day Smoker  . Smokeless tobacco: Never Used  . Alcohol use No     Review of Systems  Constitutional: Positive chills but no fever or fatigue. ENT: No sore throat. Cardiovascular: No chest pain. Respiratory: No shortness of breath.  Gastrointestinal:  Positive generalized abdominal pain.  Positive nausea, vomiting, diarrhea.  No constipation. Genitourinary: Negative for dysuria, hematuria. No urinary hesitancy, urgency or increased frequency. Musculoskeletal: Negative for back pain.  Skin: Negative for rash, redness, swelling, skin sores. Neurological: Negative for headaches, focal weakness or numbness. No tingling, saddle paresthesias, loss of bowel or bladder  control. 10-point ROS otherwise negative.  ____________________________________________   PHYSICAL EXAM:  VITAL SIGNS: ED Triage Vitals  Enc Vitals Group     BP 10/28/15 1028 112/64     Pulse Rate 10/28/15 1028 (!) 103     Resp 10/28/15 1028 18     Temp 10/28/15 1028 98 F (36.7 C)     Temp Source 10/28/15 1028 Oral     SpO2 10/28/15 1028 98 %     Weight 10/28/15 1029 220 lb (99.8 kg)     Height 10/28/15 1029 5\' 10"  (1.778 m)     Head Circumference --      Peak Flow --      Pain Score 10/28/15 1029 5     Pain Loc --      Pain Edu? --      Excl. in GC? --      Constitutional: Alert and oriented. Well appearing and in no acute distress. Eyes: Conjunctivae are normal without icterus or injection Head: Atraumatic. Neck: Supple with full range of motion Hematological/Lymphatic/Immunilogical: No cervical lymphadenopathy. Cardiovascular: Normal rate, regular rhythm. Normal S1 and S2. No murmurs, rubs, gallops. Good peripheral circulation. Respiratory: Normal respiratory effort without tachypnea or retractions. Lungs CTABWith breath sounds noted in all lung fields. No wheeze, rhonchi, rales. Gastrointestinal:Generalized mild tenderness to deep palpation of the abdomen but all areas are soft without distention or guarding. No rigidity or rebound. Bowel sounds are mildly hyperactive in all quadrants. Musculoskeletal: No lower extremity tenderness nor edema.  No joint effusions. Neurologic:  Normal speech and language. No gross focal neurologic deficits are appreciated. Gait and posture are normal Skin:  Skin is warm, dry and intact. No rash, redness, bruising, skin sores noted. Psychiatric: Mood and affect are normal. Speech and behavior are normal. Patient exhibits appropriate insight and judgement.   ____________________________________________   LABS (all labs ordered are listed, but only abnormal results are displayed)  Labs Reviewed  COMPREHENSIVE METABOLIC PANEL -  Abnormal; Notable for the following:       Result Value   Glucose, Bld 102 (*)    Total Bilirubin 1.9 (*)    All other components within normal limits  CBC - Abnormal; Notable for the following:    WBC 13.3 (*)    RBC 5.95 (*)    All other components within normal limits  URINALYSIS COMPLETEWITH MICROSCOPIC ([facility] ONLY) - Abnormal; Notable for the following:    Color, Urine YELLOW (*)    APPearance CLEAR (*)    Squamous Epithelial / LPF 0-5 (*)    All other components within normal limits  LIPASE, BLOOD   ____________________________________________  EKG  None ____________________________________________  RADIOLOGY  None ____________________________________________    PROCEDURES  Procedure(s) performed: None   Procedures   Medications  ketorolac (TORADOL) 30 MG/ML injection 30 mg (30 mg Intravenous Given 10/28/15 1125)  sodium chloride 0.9 % bolus 1,000 mL (0 mLs Intravenous Stopped 10/28/15 1250)  ondansetron (ZOFRAN) injection 4 mg (4 mg Intravenous Given 10/28/15 1125)  sodium chloride 0.9 % bolus 1,000 mL (1,000 mLs Intravenous New Bag/Given 10/28/15 1300)  ondansetron (ZOFRAN) injection 4 mg (4 mg Intravenous Given 10/28/15 1300)  acetaminophen (  TYLENOL) tablet 650 mg (650 mg Oral Given 10/28/15 1300)     ____________________________________________   INITIAL IMPRESSION / ASSESSMENT AND PLAN / ED COURSE  Pertinent labs & imaging results that were available during my care of the patient were reviewed by me and considered in my medical decision making (see chart for details).  Clinical Course  Comment By Time  Patient notes nausea has improved some and he feels more like himself than when he arrived to the emergency department. 700 mL of saline remains in the bag and the flow seems low. I moved the saline bag higher which seemed to improve flow. We'll recheck with the patient approximately 30 minutes to reassess. He has had no vomiting or diarrhea over the last  40 minutes. Hope Pigeon, PA-C 08/31 1212  Patient notes continued mild nausea and abdominal aching but overall improvement since arriving at the emergency department. Will give another liter of IV fluids, 4 mg Zofran IV as well as given oral acetaminophen and assess improvement in 30 minutes.  Hope Pigeon, PA-C 08/31 1245  Laboratory results were discussed with patient and all questions answered. Anticipate elevation in white blood count is due to the viral gastrointestinal illness. He is improving throughout his ED course and overall doing well. Hope Pigeon, PA-C 08/31 1303  Patient has been sleeping well and notes continued improvement of nausea and weakness. He has tolerated second liter of fluids as well as IV Zofran without any side effects. He has kept the acetaminophen tablet down without any increasing nausea or abdominal pain.  Hope Pigeon, PA-C 08/31 1337    Patient's diagnosis is consistent with Nausea, vomiting and diarrhea due to viral gastroenteritis. Patient will be discharged home with prescriptions for Zofran to take as directed. Patient is to stay well-hydrated with water and Gatorade and is advised to avoid any caffeine or sugary beverages. Bland diet such as the [employer] until able to increase as tolerated. 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 Patient is to follow up with his primary care provider or [facility] if symptoms persist past this treatment course. Patient is given ED precautions to return to the ED for any worsening or new symptoms.    ____________________________________________  FINAL CLINICAL IMPRESSION(S) / ED DIAGNOSES  Final diagnoses:  Nausea vomiting and diarrhea  Viral gastroenteritis      NEW MEDICATIONS STARTED DURING THIS VISIT:  New Prescriptions   ONDANSETRON (ZOFRAN) 4 MG TABLET    Take 1 tablet (4 mg total) by mouth every 8 (eight) hours as needed for nausea or vomiting.          Hope Pigeon, PA-C 10/28/15 1350    Emily Filbert, MD 10/28/15 [id number]

## 2015-10-28 NOTE — ED Notes (Signed)
See triage note   Developed some n/v/d   States has had more diarrhea than vomiting  No fever

## 2015-10-28 NOTE — ED Triage Notes (Signed)
Pt to ed with c/o n/v/d since 1 am last night, states vomited x 4 and diarrhea x 15 since it started.

## 2016-02-25 ENCOUNTER — Emergency Department
Admission: EM | Admit: 2016-02-25 | Discharge: 2016-02-25 | Disposition: A | Discharge status: Home / Self Care | Payer: BC Managed Care – PPO | Attending: Emergency Medicine | Admitting: Emergency Medicine

## 2016-02-25 DIAGNOSIS — F17200 Nicotine dependence, unspecified, uncomplicated: Secondary | ICD-10-CM | POA: Diagnosis not present

## 2016-02-25 DIAGNOSIS — F410 Panic disorder [episodic paroxysmal anxiety] without agoraphobia: Secondary | ICD-10-CM | POA: Insufficient documentation

## 2016-02-25 DIAGNOSIS — Z79899 Other long term (current) drug therapy: Secondary | ICD-10-CM | POA: Diagnosis not present

## 2016-02-25 DIAGNOSIS — Z791 Long term (current) use of non-steroidal anti-inflammatories (NSAID): Secondary | ICD-10-CM | POA: Diagnosis not present

## 2016-02-25 DIAGNOSIS — R110 Nausea: Secondary | ICD-10-CM

## 2016-02-25 DIAGNOSIS — R0789 Other chest pain: Secondary | ICD-10-CM

## 2016-02-25 LAB — CBC
HCT: 48.1 % (ref 40.0–52.0)
Hemoglobin: 16.7 g/dL (ref 13.0–18.0)
MCH: 29.1 pg (ref 26.0–34.0)
MCHC: 34.7 g/dL (ref 32.0–36.0)
MCV: 83.9 fL (ref 80.0–100.0)
Platelets: 324 10*3/uL (ref 150–440)
RBC: 5.74 MIL/uL (ref 4.40–5.90)
RDW: 12.9 % (ref 11.5–14.5)
WBC: 13.3 10*3/uL — ABNORMAL HIGH (ref 3.8–10.6)

## 2016-02-25 LAB — COMPREHENSIVE METABOLIC PANEL
ALBUMIN: 4.7 g/dL (ref 3.5–5.0)
ALK PHOS: 55 U/L (ref 38–126)
ALT: 28 U/L (ref 17–63)
AST: 28 U/L (ref 15–41)
Anion gap: 8 (ref 5–15)
BILIRUBIN TOTAL: 1.7 mg/dL — AB (ref 0.3–1.2)
BUN: 11 mg/dL (ref 6–20)
CALCIUM: 9.7 mg/dL (ref 8.9–10.3)
CO2: 27 mmol/L (ref 22–32)
CREATININE: 1 mg/dL (ref 0.61–1.24)
Chloride: 102 mmol/L (ref 101–111)
GFR calc Af Amer: >60 mL/min (ref >60)
GLUCOSE: 172 mg/dL — AB (ref 65–99)
Potassium: 3.4 mmol/L — ABNORMAL LOW (ref 3.5–5.1)
Sodium: 137 mmol/L (ref 135–145)
TOTAL PROTEIN: 7.6 g/dL (ref 6.5–8.1)

## 2016-02-25 LAB — URINALYSIS, COMPLETE (UACMP) WITH MICROSCOPIC
BACTERIA UA: NONE SEEN
Bilirubin Urine: NEGATIVE
GLUCOSE, UA: NEGATIVE mg/dL
Hgb urine dipstick: NEGATIVE
KETONES UR: NEGATIVE mg/dL
LEUKOCYTES UA: NEGATIVE
Nitrite: NEGATIVE
PH: 7 (ref 5.0–8.0)
PROTEIN: NEGATIVE mg/dL
RBC / HPF: NONE SEEN RBC/hpf (ref 0–5)
SQUAMOUS EPITHELIAL / LPF: NONE SEEN
Specific Gravity, Urine: 1.001 — ABNORMAL LOW (ref 1.005–1.030)

## 2016-02-25 LAB — TROPONIN I

## 2016-02-25 LAB — LIPASE, BLOOD: Lipase: 26 U/L (ref 11–51)

## 2016-02-25 MED ORDER — ONDANSETRON 4 MG PO TBDP
4.0000 mg | ORAL_TABLET | Freq: Once | ORAL | Status: AC
  Administered 2016-02-25: 4 mg via ORAL
  Filled 2016-02-25: qty 1

## 2016-02-25 MED ORDER — DIAZEPAM 5 MG PO TABS
5.0000 mg | ORAL_TABLET | Freq: Once | ORAL | Status: AC
  Administered 2016-02-25: 5 mg via ORAL
  Filled 2016-02-25: qty 1

## 2016-02-25 MED ORDER — BUSPIRONE HCL 10 MG PO TABS: 10.0000 mg | ORAL_TABLET | Freq: Three times a day (TID) | ORAL | 0 refills | Status: DC

## 2016-02-25 NOTE — Discharge Instructions (Signed)
Follow up with the PCP or counselor. Call Tuesday to schedule an appointment.  Return to the ER for symptoms that change or worsen if you are unable to schedule an appointment.

## 2016-02-25 NOTE — ED Provider Notes (Signed)
[facility] Emergency Department Provider Note  ____________________________________________   None    (approximate)  I have reviewed the triage vital signs and the nursing notes.   HISTORY  Chief Complaint Chest Pain   HPI Jacob Peters is a 20 y.o. male who presents to the emergency department for evaluation of chest pain that has been present for the last week. He also complains of severe anxiety and becomes more anxious when his heart rate increases. He has a significant past medical history of the same for which he is treated with Lexapro. He sees a Veterinary surgeon in [place]. He is not verbalizing any new stressors or anxiety inducing situations. He states that he has a sharp pain in the left side of his chest that increases with the amount of anxiety he is experiencing. He has had some nausea and vomiting for the past 2 days, but is keeping fluids down. He has not missed any doses of his Lexapro lately. His father accompanies him tonight and states that this is a typical panic attack.   Past Medical History:  Diagnosis Date  . Anxiety   . 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 Obesity 09/17/2015  . Palpitations     Patient Active Problem List   Diagnosis Date Noted  . Weight gain, abnormal 09/17/2015  . Preventative health care 09/17/2015  . Foot pain, left 09/17/2015  . 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 Obesity 09/17/2015  . Screening for STD (sexually transmitted disease) 03/22/2015  . Awareness of heartbeats 06/09/2013  . Anxiety 03/26/2013    No past surgical history on file.  Prior to Admission medications   Medication Sig Start Date End Date Taking? Authorizing Provider  escitalopram (LEXAPRO) 10 MG tablet Take 1 tablet (10 mg total) by mouth daily. 09/17/15   Kerman Passey, MD  meloxicam (MOBIC) 7.5 MG tablet Take 1  tablet (7.5 mg total) by mouth daily. If needed for back pain; do not take other NSAIDs; take with food 09/17/15   Kerman Passey, MD  propranolol (INDERAL) 20 MG tablet Take 1 tablet (20 mg total) by mouth 3 (three) times daily. 09/17/15   Kerman Passey, MD    Allergies Patient has no known allergies.  Family History  Problem Relation Age of Onset  . Anxiety disorder Mother   . Hypertension Father     Social History Social History  Substance Use Topics  . Smoking status: Current Every Day Smoker  . Smokeless tobacco: Never Used  . Alcohol use No    Review of Systems Constitutional: No fever/chills Eyes: No visual changes. ENT: No sore throat. Cardiovascular:Positive for chest pain. Respiratory: Positive for shortness of breath. Gastrointestinal: No abdominal pain. Positive for nausea, vomiting, and diarrhea Genitourinary: Negative for dysuria. Musculoskeletal: Negative for back pain. Skin: Negative for rash. Neurological: Negative for headaches, focal weakness or numbness. Psychiatric: Positive for panic/anxiety  ____________________________________________   PHYSICAL EXAM:  VITAL SIGNS: ED Triage Vitals  Enc Vitals Group     BP 02/25/16 2111 126/76     Pulse Rate 02/25/16 2111 91     Resp 02/25/16 2111 18     Temp 02/25/16 2111 98.2 F (36.8 C)     Temp Source 02/25/16 2111 Oral     SpO2 02/25/16 2111 100 %     Weight 02/25/16 2110 225 lb (102.1 kg)  Height 02/25/16 2110 5\' 10"  (1.778 m)     Head Circumference --      Peak Flow --      Pain Score 02/25/16 2110 2     Pain Loc --      Pain Edu? --      Excl. in GC? --     Constitutional: Alert and oriented. Well appearing and in no acute distress. Eyes: Conjunctivae are normal. PERRL. EOMI. Head: Atraumatic. Nose: No congestion/rhinnorhea. Mouth/Throat: Mucous membranes are moist.  Oropharynx non-erythematous. Neck: No stridor.   Cardiovascular: Normal rate, regular rhythm. Grossly normal heart  sounds.  Good peripheral circulation. Respiratory: Normal respiratory effort.  No retractions. Lungs CTAB. Gastrointestinal: Soft and nontender. No distention. No abdominal bruits. Musculoskeletal: No lower extremity tenderness nor edema.  No joint effusions. Neurologic:  Normal speech and language. No gross focal neurologic deficits are appreciated. No gait instability. Skin:  Skin is warm, dry and intact. No rash noted. Psychiatric: Mood and affect are normal. Speech and behavior are normal.  ____________________________________________   LABS (all labs ordered are listed, but only abnormal results are displayed)  Labs Reviewed  CBC - Abnormal; Notable for the following:       Result Value   WBC 13.3 (*)    All other components within normal limits  COMPREHENSIVE METABOLIC PANEL - Abnormal; Notable for the following:    Potassium 3.4 (*)    Glucose, Bld 172 (*)    Total Bilirubin 1.7 (*)    All other components within normal limits  URINALYSIS, COMPLETE (UACMP) WITH MICROSCOPIC - Abnormal; Notable for the following:    Color, Urine COLORLESS (*)    APPearance CLEAR (*)    Specific Gravity, Urine 1.001 (*)    All other components within normal limits  TROPONIN I  LIPASE, BLOOD   ____________________________________________  EKG   Date: 02/25/2016  Rate: 87  Rhythm: normal sinus rhythm  QRS Axis: normal  Intervals: normal  ST/T Wave abnormalities: normal  Conduction Disutrbances: none  Narrative Interpretation: Normal sinus rhythm        ____________________________________________  RADIOLOGY  Not indicated ____________________________________________   PROCEDURES  Procedure(s) performed: None  Procedures  Critical Care performed: No  ____________________________________________   INITIAL IMPRESSION / ASSESSMENT AND PLAN / ED COURSE  Pertinent labs & imaging results that were available during my care of the patient were reviewed by me and  considered in my medical decision making (see chart for details).  20 year old male presenting to the emergency department for evaluation of panic attack. Panic attacks have been recurrent over the past week and patient is concerned because his heart rate increases and he begins to have a sharp pain in the left chest wall. These symptoms are chronic and he is under the care of psychology. He has taken his Lexapro and his propanolol, but states that his heart rate continues to increase and this increases his anxiety. While in the emergency department tonight, he was given 5 mg of Valium and 4 mg of Zofran as he had also complained of some nausea and vomiting. Plan will be to discharge him once his anxiety is more under control. He denies any suicidal or homicidal ideations. He is here with his father who will be able to drive him home.  Clinical Course as of Feb 25 2328  Fri Feb 25, 2016  2327 Patient reports relief of nausea after Zofran. He also reports feeling much more calm than when he arrived in the emergency department.  We discussed follow-up with counselor and the possibility of the need for additional medications to control panic and anxiety attacks.  [CT]    Clinical Course User Index [CT] Chinita Pesterari B Sovereign Ramiro, FNP     ____________________________________________    FINAL CLINICAL IMPRESSION(S) / ED DIAGNOSES  Prescription for BuSpar written to be taken 3 times per day if needed. He is to follow-up with his primary care provider/counselor for further evaluation of panic and anxiety. He was strongly encouraged not to miss any of his doses of Lexapro. He was advised to return to the emergency department for symptoms that change or worsen if he is unable to schedule an appointment. Prior to discharge, he reported feeling calm and denied nausea.  Final diagnoses:  None      NEW MEDICATIONS STARTED DURING THIS VISIT:  New Prescriptions   No medications on file     Note:  This  document was prepared using Dragon voice recognition software and may include unintentional dictation errors.    Chinita PesterCari B Mariana Wiederholt, FNP 02/25/16 2352    Minna AntisKevin Paduchowski, MD 02/29/16 2238

## 2016-02-25 NOTE — ED Notes (Signed)
Pt is in good condition; discharge instructions reviewed; follow up care and home care reviewed; prescription medication reviewed; pt verbalized understanding; pt is ambulatory and went home with dad

## 2016-02-25 NOTE — ED Triage Notes (Signed)
Pt in with co chest pain for a week, states has anxiety to "worrying about the pain" won't let him sleep. Has had vomiting and diarrhea for 2 days, denies any abd pain.

## 2016-02-25 NOTE — ED Provider Notes (Signed)
-----------------------------------------   11:44 PM on 02/25/2016 -----------------------------------------  EKG reviewed and interpreted by myself shows normal sinus rhythm at 87 bpm, narrow QRS, normal axis, normal intervals, no ST changes. Normal EKG.   Minna AntisKevin Devyn Griffing, MD 02/25/16 [phone]

## 2016-02-28 ENCOUNTER — Ambulatory Visit (HOSPITAL_COMMUNITY)
Admission: EM | Admit: 2016-02-28 | Discharge: 2016-02-28 | Disposition: A | Discharge status: Home / Self Care | Payer: BC Managed Care – PPO | Attending: Family Medicine | Admitting: Family Medicine

## 2016-02-28 ENCOUNTER — Encounter (HOSPITAL_COMMUNITY): Payer: Self-pay | Admitting: Emergency Medicine

## 2016-02-28 DIAGNOSIS — F418 Other specified anxiety disorders: Secondary | ICD-10-CM

## 2016-02-28 MED ORDER — HYDROXYZINE HCL 50 MG PO TABS: 50.0000 mg | ORAL_TABLET | Freq: Four times a day (QID) | ORAL | 0 refills | Status: DC | PRN

## 2016-02-28 NOTE — ED Provider Notes (Signed)
CSN: [id number]     Arrival date & time 02/28/16  1808 History   First MD Initiated Contact with Patient 02/28/16 1913     Chief Complaint  Patient presents with  . Chest Pain   (Consider location/radiation/quality/duration/timing/severity/associated sxs/prior Treatment) 21 year old male presents to clinic with anxiety symptoms. Patient was evaluated Friday at [facility] for chest pain. He was started on Buspar for anxiety. Patient reports his symptoms have no improved, he is unable to sleep, rest, or concentrate. He has had nausea and a feeling of shortness of breath. He reports his current symptoms are consistent with prior panic attacks and consistent with his encounter Friday.   The history is provided by the patient.  Chest Pain  Associated symptoms: nausea and shortness of breath   Associated symptoms: no abdominal pain, no cough and no vomiting     Past Medical History:  Diagnosis Date  . Anxiety   . 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 Obesity 09/17/2015  . Palpitations    History reviewed. No pertinent surgical history. Family History  Problem Relation Age of Onset  . Anxiety disorder Mother   . Hypertension Father    Social History  Substance Use Topics  . Smoking status: Never Smoker  . Smokeless tobacco: Former NeurosurgeonUser    Types: Snuff    Quit date: 02/25/2016  . Alcohol use No    Review of Systems  Constitutional: Negative.   HENT: Negative.   Respiratory: Positive for chest tightness and shortness of breath. Negative for cough and wheezing.   Cardiovascular: Positive for chest pain.  Gastrointestinal: Positive for nausea. Negative for abdominal pain, diarrhea and vomiting.  Genitourinary: Negative.   Musculoskeletal: Negative.     Allergies  Patient has no known allergies.  Home Medications   Prior to Admission medications   Medication Sig Start  Date End Date Taking? Authorizing Provider  busPIRone (BUSPAR) 10 MG tablet Take 1 tablet (10 mg total) by mouth 3 (three) times daily. 02/25/16   Chinita Pesterari B Triplett, FNP  escitalopram (LEXAPRO) 10 MG tablet Take 1 tablet (10 mg total) by mouth daily. 09/17/15   Kerman PasseyMelinda P Lada, MD  hydrOXYzine (ATARAX/VISTARIL) 50 MG tablet Take 1 tablet (50 mg total) by mouth every 6 (six) hours as needed for anxiety. 02/28/16 03/09/16  Dorena BodoLawrence Dorinne Graeff, NP  meloxicam (MOBIC) 7.5 MG tablet Take 1 tablet (7.5 mg total) by mouth daily. If needed for back pain; do not take other NSAIDs; take with food 09/17/15   Kerman PasseyMelinda P Lada, MD  propranolol (INDERAL) 20 MG tablet Take 1 tablet (20 mg total) by mouth 3 (three) times daily. 09/17/15   Kerman PasseyMelinda P Lada, MD   Meds Ordered and Administered this Visit  Medications - No data to display  BP 123/69 (BP Location: Left Arm)   Pulse (!) 55   Temp 98.3 F (36.8 C) (Oral)   Resp 16   Ht 5\' 10"  (1.778 m)   Wt 220 lb (99.8 kg)   SpO2 99%   BMI 31.57 kg/m  No data found.   Physical Exam  Constitutional: He appears well-developed and well-nourished. He appears distressed (appearing anxious).  HENT:  Head: Normocephalic.  Right Ear: External ear normal.  Left Ear: External ear normal.  Mouth/Throat: Oropharynx is clear and moist. No oropharyngeal exudate.  Eyes: Pupils are equal, round, and reactive to light.  Neck: Normal range of  motion. Neck supple. No JVD present.  Cardiovascular: Regular rhythm and normal heart sounds.  Bradycardia present.   Pulmonary/Chest: Effort normal and breath sounds normal. No respiratory distress. He has no wheezes.  Abdominal: Soft. Bowel sounds are normal.  Lymphadenopathy:    He has no cervical adenopathy.  Skin: Skin is warm and dry. Capillary refill takes less than 2 seconds. He is not diaphoretic.  Psychiatric: His behavior is normal. Judgment and thought content normal. His mood appears anxious. His speech is rapid and/or pressured.  Cognition and memory are normal.  Nursing note and vitals reviewed.   Urgent Care Course   Clinical Course     Procedures (including critical care time)  Labs Review Labs Reviewed - No data to display  Imaging Review No results found.   Visual Acuity Review  Right Eye Distance:   Left Eye Distance:   Bilateral Distance:    Right Eye Near:   Left Eye Near:    Bilateral Near:         MDM   1. Anxiety about health    Patient given a prescription for Atarax for anxiety and advised to follow up with his PCP and to keep his appointment with his counselor in there morning. Should symptoms worsen or fail to resolve recommend being seen at the ER for further evaluation.    Dorena Bodo, NP 02/28/16 1940

## 2016-02-28 NOTE — Discharge Instructions (Signed)
I recommend you follow up with your primary care provider as soon as possible for management of you anxiety. I also recommend you keep your appointment tomorrow with your therapist. You have been given a prescription for hydroxyzine for anxiety. Should your symptoms worsen I recommend [facility] for their behavior health team.

## 2016-02-28 NOTE — ED Triage Notes (Signed)
PT reports he struggles with anxiety. PT began having chest pain Friday. PT reporst SOB, nausea, and dizziness with pain. PT was seen in the ER Friday at [facility] for chest pain. PT was started on Buspar Friday for anxiety. PT also takes propanolol 20 mg TID and Lexapro 10 mg QD.

## 2016-03-02 ENCOUNTER — Other Ambulatory Visit: Payer: Self-pay

## 2016-03-02 ENCOUNTER — Encounter: Payer: Self-pay | Admitting: Family Medicine

## 2016-03-02 ENCOUNTER — Ambulatory Visit (INDEPENDENT_AMBULATORY_CARE_PROVIDER_SITE_OTHER): Payer: BC Managed Care – PPO | Admitting: Family Medicine

## 2016-03-02 DIAGNOSIS — F4001 Agoraphobia with panic disorder: Secondary | ICD-10-CM | POA: Diagnosis not present

## 2016-03-02 DIAGNOSIS — F1921 Other psychoactive substance dependence, in remission: Secondary | ICD-10-CM | POA: Diagnosis not present

## 2016-03-02 DIAGNOSIS — D72829 Elevated white blood cell count, unspecified: Secondary | ICD-10-CM | POA: Insufficient documentation

## 2016-03-02 DIAGNOSIS — F419 Anxiety disorder, unspecified: Secondary | ICD-10-CM | POA: Diagnosis not present

## 2016-03-02 DIAGNOSIS — E876 Hypokalemia: Secondary | ICD-10-CM

## 2016-03-02 DIAGNOSIS — D72828 Other elevated white blood cell count: Secondary | ICD-10-CM | POA: Diagnosis not present

## 2016-03-02 DIAGNOSIS — R197 Diarrhea, unspecified: Secondary | ICD-10-CM | POA: Insufficient documentation

## 2016-03-02 DIAGNOSIS — F418 Other specified anxiety disorders: Secondary | ICD-10-CM

## 2016-03-02 LAB — GLUCOSE, POCT (MANUAL RESULT ENTRY): POC GLUCOSE: 85 mg/dL (ref 70–99)

## 2016-03-02 MED ORDER — BUSPIRONE HCL 10 MG PO TABS: 10.0000 mg | ORAL_TABLET | Freq: Three times a day (TID) | ORAL | 1 refills | Status: DC

## 2016-03-02 MED ORDER — HYDROXYZINE HCL 25 MG PO TABS: 25.0000 mg | ORAL_TABLET | Freq: Four times a day (QID) | ORAL | 1 refills | Status: DC | PRN

## 2016-03-02 NOTE — Telephone Encounter (Signed)
Thank you I sent in refills of the hydroxyzine and buspirone I think there was a misunderstanding We're checking his potassium with the bloodwork, but I wasn't going to prescribe anything for it until I see the results because his last level was only one-tenth of a point low We'll know tomorrow if he can just drink orange juice and eat bananas or if he needs pills He can certainly eat a banana daily for a few days to bring that up Thank you

## 2016-03-02 NOTE — Assessment & Plan Note (Signed)
Check cbc again today.  

## 2016-03-02 NOTE — Assessment & Plan Note (Signed)
Check 5-HIAA

## 2016-03-02 NOTE — Assessment & Plan Note (Signed)
Refer to psychiatry; relaxation response recommended

## 2016-03-02 NOTE — Patient Instructions (Signed)
Let's have you start working with a psychiatrist If you have not heard anything from my staff in a week about any orders/referrals/studies from today, please contact us here to follow-up [phone]  12 Ways to Curb Anxiety  ?Anxiety is normal human sensation. It is what helped our ancestors survive the pitfalls of the wilderness. Anxiety is defined as experiencing worry or nervousness about an imminent event or something with an uncertain outcome. It is a feeling experienced by most people at some point in their lives. Anxiety can be triggered by a very personal issue, such as the illness of a loved one, or an event of global proportions, such as a refugee crisis. Some of the symptoms of anxiety are:  Feeling restless.  Having a feeling of impending danger.  Increased heart rate.  Rapid breathing. Sweating.  Shaking.  Weakness or feeling tired.  Difficulty concentrating on anything except the current worry.  Insomnia.  Stomach or bowel problems. What can we do about anxiety we may be feeling? There are many techniques to help manage stress and relax. Here are 12 ways you can reduce your anxiety almost immediately: 1. Turn off the constant feed of information. Take a social media sabbatical. Studies have shown that social media directly contributes to social anxiety.  2. Monitor your television viewing habits. Are you watching shows that are also contributing to your anxiety, such as 24-hour news stations? Try watching something else, or better yet, nothing at all. Instead, listen to music, read an inspirational book or practice a hobby. 3. Eat nutritious meals. Also, don't skip meals and keep healthful snacks on hand. Hunger and poor diet contributes to feeling anxious. 4. Sleep. Sleeping on a regular schedule for at least seven to eight hours a night will do wonders for your outlook when you are awake. 5. Exercise. Regular exercise will help rid your body of that anxious energy and help you  get more restful sleep. 6. Try deep (diaphragmatic) breathing. Inhale slowly through your nose for five seconds and exhale through your mouth. 7. Practice acceptance and gratitude. When anxiety hits, accept that there are things out of your control that shouldn't be of immediate concern.  8. Seek out humor. When anxiety strikes, watch a funny video, read jokes or call a friend who makes you laugh. Laughter is healing for our bodies and releases endorphins that are calming. 9. Stay positive. Take the effort to replace negative thoughts with positive ones. Try to see a stressful situation in a positive light. Try to come up with solutions rather than dwelling on the problem. 10. Figure out what triggers your anxiety. Keep a journal and make note of anxious moments and the events surrounding them. This will help you identify triggers you can avoid or even eliminate. 11. Talk to someone. Let a trusted friend, family member or even trained professional know that you are feeling overwhelmed and anxious. Verbalize what you are feeling and why.  12. Volunteer. If your anxiety is triggered by a crisis on a large scale, become an advocate and work to resolve the problem that is causing you unease. Anxiety is often unwelcome and can become overwhelming. If not kept in check, it can become a disorder that could require medical treatment. However, if you take the time to care for yourself and avoid the triggers that make you anxious, you will be able to find moments of relaxation and clarity that make your life much more enjoyable.    Steps to Elicit  the Relaxation Response The following is the technique reprinted with permission from Dr. Billy Fischer book The Relaxation Response pages 162-163 1. Sit quietly in a comfortable position. 2. Close your eyes. 3. Deeply relax all your muscles,  beginning at your feet and progressing up to your face.  Keep them relaxed. 4. Breathe through your nose.  Become  aware of your breathing.  As you breathe out, say the word, "one"*,  silently to yourself. For example,  breathe in ... out, "one",- in .. out, "one", etc.  Breathe easily and naturally. 5. Continue for 10 to 20 minutes.  You may open your eyes to check the time, but do not use an alarm.  When you finish, sit quietly for several minutes,  at first with your eyes closed and later with your eyes opened.  Do not stand up for a few minutes. 6. Do not worry about whether you are successful  in achieving a deep level of relaxation.  Maintain a passive attitude and permit relaxation to occur at its own pace.  When distracting thoughts occur,  try to ignore them by not dwelling upon them  and return to repeating "one."  With practice, the response should come with little effort.  Practice the technique once or twice daily,  but not within two hours after any meal,  since the digestive processes seem to interfere with  the elicitation of the Relaxation Response. * It is better to use a soothing, mellifluous sound, preferably with no meaning. or association, to avoid stimulation of unnecessary thoughts - a mantra.

## 2016-03-02 NOTE — Assessment & Plan Note (Signed)
Severe panic attacks; refer to psychiatry; continue meds for now; working with therapist

## 2016-03-02 NOTE — Telephone Encounter (Signed)
Patient notified

## 2016-03-02 NOTE — Progress Notes (Signed)
BP 110/82   Pulse 72   Temp 98.1 F (36.7 C) (Oral)   Resp 14   Wt 224 lb (101.6 kg)   SpO2 95%   BMI 32.14 kg/m    Subjective:    Patient ID: Jacob SmartGrant A Yapp, male    DOB: [birth year], 21 y.o.   MRN: [id number]  HPI: Jacob Peters is a 21 y.o. male  Chief Complaint  Patient presents with  . Chest Pain    has went to ER twice last friday and monday was dx with anxiety.  EKG,chest xray and labs which all came back normal.  Symptoms include: chest pain, racing heart, insomnia, loss appetite,dry mouth,nausea and vomiting  . Anxiety   Patient is here for ER follow-up  He has had pain in the chest, then started using a heart rate monitor and obsessively checking his heart rate monitor and googling heart rates and freaking out; he has palpitations; when he takes deep breaths, he'll feel a skipped beat or extra beat feeling; if he holds his breath like he does subconsciously, it feels like it is beating hard; had cardiologist evaluate him about 5 years ago, had echocardiogram he says; he obsesses over his heart; two family members died from heart problems and he worries about his heart; he has a therapist but does not see a psychiatrist  In regards to his previous cardiologist evaluation, I reviewed Dr. Assunta Gamblesao's note, 21 yo patient, fast heart beat, palpitations; [facility] to that; [facility]  Paternal GF died at 22 years old; paternal uncle died at 22 years old had sleep apnea, but possible heart attack  Has really cut down on caffeine since the episodes  He was really inconsistent with taking the lexapro for a while; back to taking now; he has taken the hydroxyzine and has taken that before; works okay, makes him just a little tired; does not really help the anxiety; he has started the buspar but that was just six days ago, so hard to tell if helping  Potassium was a little low at 3.4; not many fruits; drinking a ton of water; throwing up and  having diarrhea Water, fasting otherwise this morning Every time he eats, his heart rate goes up after he eats; noticeably uncomfortable; loose stools since Thursday; have woken up sweating; no facial flushing, no headaches  [facility] 03/02/2016 09/17/2015 03/22/2015  Decreased Interest 2 0 0  Down, Depressed, Hopeless 2 0 1  PHQ - 2 Score 4 0 1  Altered sleeping 2 - -  Tired, decreased energy 2 - -  Change in appetite 3 - -  Feeling bad or failure about yourself  0 - -  Trouble concentrating 2 - -  Moving slowly or fidgety/restless 1 - -  Suicidal thoughts 0 - -  PHQ-9 Score 14 - -  Difficult doing work/chores Extremely dIfficult - -   Relevant past medical, surgical, family and social history reviewed Past Medical History:  Diagnosis Date  . Anxiety   . Guillain-Barre syndrome (HCC) 2000  . 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 Obesity 09/17/2015  . Palpitations    History reviewed. No pertinent surgical history.   Family History  Problem Relation Age of Onset  . Anxiety disorder Mother   . Hypertension Father   . Heart disease Paternal Uncle   .  Heart disease Paternal Grandfather    Social History  Substance Use Topics  . Smoking status: Former Smoker    Types: Cigarettes    Quit date: 02/25/2016  . Smokeless tobacco: Former Neurosurgeon    Types: Snuff    Quit date: 02/25/2016     Comment: smoked 1 pack a week and chewed tobacco until Friday  . Alcohol use No   Interim medical history since last visit reviewed. Allergies and medications reviewed  Review of Systems Per HPI unless specifically indicated above     Objective:    BP 110/82   Pulse 72   Temp 98.1 F (36.7 C) (Oral)   Resp 14   Wt 224 lb (101.6 kg)   SpO2 95%   BMI 32.14 kg/m   Wt Readings from Last 3 Encounters:  03/02/16 224 lb (101.6 kg)  02/28/16 220 lb (99.8 kg)  02/25/16 225  lb (102.1 kg)    Physical Exam  Constitutional: He appears well-developed and well-nourished. No distress.  Eyes: No scleral icterus.  Neck: No JVD present.  Cardiovascular: Normal rate, regular rhythm and normal heart sounds.   No extrasystoles are present. PMI is not displaced.  Exam reveals no gallop.   No murmur heard. Pulmonary/Chest: Effort normal and breath sounds normal.  Neurological: He is alert. He displays no tremor.  Skin: He is not diaphoretic. No pallor.  No facial flushing  Psychiatric: His mood appears anxious. His speech is not rapid and/or pressured and not tangential. He is not agitated and not hyperactive. He does not express impulsivity. He does not exhibit a depressed mood. He expresses no homicidal and no suicidal ideation.   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    Severe panic attacks; refer to psychiatry; continue meds for now; working with therapist      Relevant Orders   Ambulatory referral to Psychiatry   Medication addiction in remission [facility])    I am not inclined to start this patient back on a benzo, given his history of benzo use plus alcohol during college days; in remission since July 2017 after going to rehab      Hypokalemia    Check K+ and Mg2+      Relevant Orders   BASIC METABOLIC PANEL WITH GFR   Magnesium   Elevated white blood cell count    Check cbc again today      Relevant Orders   CBC with Differential/Platelet   Diarrhea    Check 5-HIAA      Relevant Orders   POCT Glucose (CBG) (Completed)   5 HIAA, quantitative, urine, 24 hour   Anxiety about health    Patient seems focused on his heart, obsessively checking it; I recommended he stop checking his pulse so frequently, try the  relaxation response, and we'll have him start working with a psychiatrist      Anxiety    Refer to psychiatry; relaxation response recommended      Relevant Orders   Ambulatory referral to Psychiatry   TSH      Follow up plan: Return in about 2 weeks (around 03/16/2016) for follow-up.  An after-visit summary was printed and given to the  patient at check-out.  Please see the patient instructions which may contain other information and recommendations beyond what is mentioned above in the assessment and plan.  Meds ordered this encounter  Medications  . DISCONTD: hydrOXYzine (ATARAX/VISTARIL) 25 MG tablet    Sig: Take 25 mg by mouth.    Orders Placed This Encounter  Procedures  . BASIC METABOLIC PANEL WITH GFR  . Magnesium  . CBC with Differential/Platelet  . TSH  . 5 HIAA, quantitative, urine, 24 hour  . Ambulatory referral to Psychiatry  . POCT Glucose (CBG)

## 2016-03-02 NOTE — Telephone Encounter (Signed)
Patient did not get refills and states that you were going to rx potassium?

## 2016-03-02 NOTE — Assessment & Plan Note (Signed)
Check K+ and Mg2+ 

## 2016-03-05 DIAGNOSIS — F418 Other specified anxiety disorders: Secondary | ICD-10-CM | POA: Insufficient documentation

## 2016-03-05 DIAGNOSIS — R4589 Other symptoms and signs involving emotional state: Secondary | ICD-10-CM | POA: Insufficient documentation

## 2016-03-05 NOTE — Assessment & Plan Note (Signed)
I am not inclined to start this patient back on a benzo, given his history of benzo use plus alcohol during college days; in remission since July 2017 after going to rehab

## 2016-03-05 NOTE — Assessment & Plan Note (Signed)
Patient seems focused on his heart, obsessively checking it; I recommended he stop checking his pulse so frequently, try the relaxation response, and we'll have him start working with a psychiatrist

## 2016-03-08 ENCOUNTER — Other Ambulatory Visit: Payer: Self-pay

## 2016-03-08 DIAGNOSIS — R197 Diarrhea, unspecified: Secondary | ICD-10-CM

## 2016-03-09 LAB — CBC WITH DIFFERENTIAL/PLATELET
BASOS ABS: 0 {cells}/uL (ref 0–200)
Basophils Relative: 0 %
EOS PCT: 1 %
Eosinophils Absolute: 80 cells/uL (ref 15–500)
HCT: 47.1 % (ref 38.5–50.0)
Hemoglobin: 16.2 g/dL (ref 13.2–17.1)
Lymphocytes Relative: 34 %
Lymphs Abs: 2720 cells/uL (ref 850–3900)
MCH: 29.2 pg (ref 27.0–33.0)
MCHC: 34.4 g/dL (ref 32.0–36.0)
MCV: 85 fL (ref 80.0–100.0)
MONOS PCT: 7 %
MPV: 9.8 fL (ref 7.5–12.5)
Monocytes Absolute: 560 cells/uL (ref 200–950)
NEUTROS PCT: 58 %
Neutro Abs: 4640 cells/uL (ref 1500–7800)
PLATELETS: 350 10*3/uL (ref 140–400)
RBC: 5.54 MIL/uL (ref 4.20–5.80)
RDW: 13.3 % (ref 11.0–15.0)
WBC: 8 10*3/uL (ref 3.8–10.8)

## 2016-03-09 LAB — MAGNESIUM: MAGNESIUM: 2.1 mg/dL (ref 1.5–2.5)

## 2016-03-09 LAB — BASIC METABOLIC PANEL WITH GFR
BUN: 7 mg/dL (ref 7–25)
CALCIUM: 9.5 mg/dL (ref 8.6–10.3)
CO2: 29 mmol/L (ref 20–31)
CREATININE: 1.1 mg/dL (ref 0.60–1.35)
Chloride: 101 mmol/L (ref 98–110)
GFR, Est Non African American: >89 mL/min (ref >=60)
GLUCOSE: 86 mg/dL (ref 65–99)
Potassium: 4.3 mmol/L (ref 3.5–5.3)
Sodium: 137 mmol/L (ref 135–146)

## 2016-03-09 LAB — TSH: TSH: 0.56 m[IU]/L (ref 0.40–4.50)

## 2016-03-16 LAB — 5 HIAA, QUANTITATIVE, URINE, 24 HOUR: 5-HIAA, URINE: 3.2 mg/(24.h) (ref <=6.0)

## 2016-03-20 ENCOUNTER — Ambulatory Visit (INDEPENDENT_AMBULATORY_CARE_PROVIDER_SITE_OTHER): Payer: BC Managed Care – PPO | Admitting: Family Medicine

## 2016-03-20 ENCOUNTER — Encounter: Payer: Self-pay | Admitting: Family Medicine

## 2016-03-20 DIAGNOSIS — F1921 Other psychoactive substance dependence, in remission: Secondary | ICD-10-CM | POA: Diagnosis not present

## 2016-03-20 DIAGNOSIS — F419 Anxiety disorder, unspecified: Secondary | ICD-10-CM | POA: Diagnosis not present

## 2016-03-20 DIAGNOSIS — F4001 Agoraphobia with panic disorder: Secondary | ICD-10-CM | POA: Diagnosis not present

## 2016-03-20 MED ORDER — BUSPIRONE HCL 15 MG PO TABS: 15.0000 mg | ORAL_TABLET | Freq: Three times a day (TID) | ORAL | 2 refills | Status: DC

## 2016-03-20 NOTE — Progress Notes (Signed)
BP 118/62   Pulse 86   Temp 98.9 F (37.2 C)   Resp 14   Wt 224 lb 4 oz (101.7 kg)   SpO2 96%   BMI 32.18 kg/m    Subjective:    Patient ID: Jacob Peters, male    DOB: 1995/06/05, 21 y.o.   MRN: [id number]  HPI: Jacob Peters is a 21 y.o. male  Chief Complaint  Patient presents with  . Follow-up    2 weeks    Patient is here for 2 week f/u Just dealing with anxiety, still nervous to do things Coming to the doctor is okay, feels safe  School and shopping outside comfort zone  He got a call from psychiatrist and they called him, but does not have appt scheduled yet He does not really notice a difference in the buspirone; he feels better than he did a month ago He does feel better The propranolol is still the most important medicine He is taking the hydroxyzine  He has tried the relaxation technique, has done it throughout his life Hard to focus on a positive place; really fixates on stuff going on in his body When he first wakes up and then after eating, heart rate skyrockets, goes up to 150 or 160 even with propranolol Had already been to [facility] cardiologist; if he eats a big meal, then gets up and heart rate goes up He has taken the propranolol four times a day at times when feeling heart racing His low heart rate is in the 40s and high was 195 on holter monitors  Depression screen [facility] 2/9 03/20/2016 03/02/2016 09/17/2015 03/22/2015  Decreased Interest 1 2 0 0  Down, Depressed, Hopeless 0 2 0 1  PHQ - 2 Score 1 4 0 1  Altered sleeping 0 2 - -  Tired, decreased energy 0 2 - -  Change in appetite 0 3 - -  Feeling bad or failure about yourself  0 0 - -  Trouble concentrating 0 2 - -  Moving slowly or fidgety/restless 0 1 - -  Suicidal thoughts 0 0 - -  PHQ-9 Score 1 14 - -  Difficult doing work/chores - Extremely dIfficult - -   Relevant past medical, surgical, family and social history reviewed Past Medical History:  Diagnosis Date  . Anxiety   .  Guillain-Barre syndrome (HCC) 2000  . 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 Obesity 09/17/2015  . Palpitations    No past surgical history on file. Family History  Problem Relation Age of Onset  . Anxiety disorder Mother   . Hypertension Father   . Heart disease Paternal Uncle   . Heart disease Paternal Grandfather    Social History  Substance Use Topics  . Smoking status: Former Smoker    Types: Cigarettes    Quit date: 02/25/2016  . Smokeless tobacco: Former Neurosurgeon    Types: Chew     Comment: smoked 1 pack a week and chewed tobacco until Friday  . Alcohol use No   Interim medical history since last visit reviewed. Allergies and medications reviewed  Review of Systems Per HPI unless specifically indicated above     Objective:    BP 118/62   Pulse 86   Temp 98.9 F (37.2 C)   Resp 14   Wt 224 lb 4 oz (101.7 kg)   SpO2 96%  BMI 32.18 kg/m   Wt Readings from Last 3 Encounters:  03/20/16 224 lb 4 oz (101.7 kg)  03/02/16 224 lb (101.6 kg)  02/28/16 220 lb (99.8 kg)    Physical Exam  Constitutional: He appears well-developed and well-nourished. No distress.  Eyes: No scleral icterus.  Cardiovascular: Normal rate and regular rhythm.   No extrasystoles are present. PMI is not displaced.  Exam reveals no gallop.   No murmur heard. Pulmonary/Chest: Effort normal and breath sounds normal.  Neurological: He is alert. He displays no tremor.  Skin: He is not diaphoretic. No pallor.  No facial flushing  Psychiatric: His mood appears anxious. His speech is not rapid and/or pressured and not tangential. He is not agitated and not hyperactive. He does not express impulsivity. He does not exhibit a depressed mood. He expresses no homicidal and no suicidal ideation.    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  An after-visit summary was printed and given to the patient at check-out.  Please see the patient instructions which may contain other information and recommendations beyond what is mentioned above in the assessment and plan.  Meds ordered this encounter  Medications  . hydrOXYzine (ATARAX/VISTARIL) 25 MG tablet    Sig: Take 25 mg by mouth 3 (three) times daily as needed.  . busPIRone (BUSPAR) 15 MG tablet    Sig: Take 1 tablet (15 mg total) by mouth 3 (three) times daily.    Dispense:  90 tablet    Refill:  2    Increasing dose    No orders of the defined types were placed in this encounter.  Face-to-face time with patient was more than 15 minutes, >50% time spent counseling and coordination of care

## 2016-03-20 NOTE — Patient Instructions (Signed)
Please do contact the psychiatrist to get an appointment Increase the buspirone from 10 mg to 15 mg three times a day Keep doing the positive things you are doing I'm here as a resource Please call me with an update in 3-4 weeks

## 2016-03-29 ENCOUNTER — Telehealth: Payer: Self-pay | Admitting: Family Medicine

## 2016-03-29 ENCOUNTER — Other Ambulatory Visit: Payer: Self-pay

## 2016-03-29 NOTE — Telephone Encounter (Signed)
Pt needs refill on Lexapro

## 2016-03-30 NOTE — Telephone Encounter (Signed)
Called patient and left him a voicemail to give me a call back regarding his refill request. Pt asked for refills of lexapro but he has 11 refills.

## 2016-03-31 NOTE — Assessment & Plan Note (Signed)
Avoiding benzos

## 2016-03-31 NOTE — Assessment & Plan Note (Addendum)
Avoiding benzo; relaxation response encouraged; SSRI and beta-blocker and buspar and hydroxyzine

## 2016-03-31 NOTE — Assessment & Plan Note (Signed)
Continue medicine, adjust buspar; patient to see psychiatrist; avoiding benzos; try relaxation response

## 2016-05-19 ENCOUNTER — Other Ambulatory Visit: Payer: Self-pay | Admitting: Family Medicine

## 2016-08-15 ENCOUNTER — Other Ambulatory Visit: Payer: Self-pay

## 2016-08-15 MED ORDER — PROPRANOLOL HCL 20 MG PO TABS: 20.0000 mg | ORAL_TABLET | Freq: Three times a day (TID) | ORAL | 11 refills | Status: DC

## 2016-09-15 ENCOUNTER — Emergency Department
Admission: EM | Admit: 2016-09-15 | Discharge: 2016-09-15 | Disposition: A | Discharge status: Home / Self Care | Payer: BC Managed Care – PPO | Attending: Emergency Medicine | Admitting: Emergency Medicine

## 2016-09-15 ENCOUNTER — Encounter: Payer: Self-pay | Admitting: Emergency Medicine

## 2016-09-15 ENCOUNTER — Emergency Department: Payer: BC Managed Care – PPO

## 2016-09-15 DIAGNOSIS — Y999 Unspecified external cause status: Secondary | ICD-10-CM | POA: Diagnosis not present

## 2016-09-15 DIAGNOSIS — S61411A Laceration without foreign body of right hand, initial encounter: Secondary | ICD-10-CM | POA: Diagnosis not present

## 2016-09-15 DIAGNOSIS — Z87891 Personal history of nicotine dependence: Secondary | ICD-10-CM | POA: Diagnosis not present

## 2016-09-15 DIAGNOSIS — S6991XA Unspecified injury of right wrist, hand and finger(s), initial encounter: Secondary | ICD-10-CM | POA: Diagnosis present

## 2016-09-15 DIAGNOSIS — Y9351 Activity, roller skating (inline) and skateboarding: Secondary | ICD-10-CM | POA: Diagnosis not present

## 2016-09-15 DIAGNOSIS — F10920 Alcohol use, unspecified with intoxication, uncomplicated: Secondary | ICD-10-CM | POA: Insufficient documentation

## 2016-09-15 DIAGNOSIS — S61213A Laceration without foreign body of left middle finger without damage to nail, initial encounter: Secondary | ICD-10-CM | POA: Diagnosis not present

## 2016-09-15 DIAGNOSIS — Y929 Unspecified place or not applicable: Secondary | ICD-10-CM | POA: Insufficient documentation

## 2016-09-15 DIAGNOSIS — Z79899 Other long term (current) drug therapy: Secondary | ICD-10-CM | POA: Insufficient documentation

## 2016-09-15 DIAGNOSIS — T148XXA Other injury of unspecified body region, initial encounter: Secondary | ICD-10-CM

## 2016-09-15 DIAGNOSIS — S61011A Laceration without foreign body of right thumb without damage to nail, initial encounter: Secondary | ICD-10-CM | POA: Diagnosis not present

## 2016-09-15 MED ORDER — LIDOCAINE HCL (PF) 1 % IJ SOLN
INTRAMUSCULAR | Status: AC
  Filled 2016-09-15: qty 10

## 2016-09-15 MED ORDER — LIDOCAINE HCL (PF) 1 % IJ SOLN
INTRAMUSCULAR | Status: AC
  Filled 2016-09-15: qty 5

## 2016-09-15 NOTE — ED Triage Notes (Signed)
Patient was skateboarding today with 8 beers on board and fell onto a bottle causing a 1.5" lac to right hand lower palm.  Bleeding controlled except for slight leak.  Patient is AOx4.  Pt also has a slight abrasion to chin and right knee with no bleeding noted.

## 2016-09-15 NOTE — ED Provider Notes (Signed)
[facility] Emergency Department Provider Note  ____________________________________________   First MD Initiated Contact with Patient 09/15/16 2105     (approximate)  I have reviewed the triage vital signs and the nursing notes.   HISTORY  Chief Complaint Laceration   HPI Jacob Peters is a 21 y.o. male with a history of anxiety as well as Jacob Peters syndrome who is presenting emergency department and after a fall. The patient says that he was skateboarding after drinking about 8 beers when he fell from his skateboard. He says that he was going downhill and fell forward with his outstretched hands causing several lacerations. He says that he has decreased sensation on the right hand to his pinky as well as ring finger. He says that he has had a tetanus shot within the last 5 years. Denies hitting his head or having any headache or neck pain at this time. Also sustained abrasions to his trunk. No lower extremity pain. Says that he saw a considerable amount of blood from his right hand coming from his palm laceration and this is why he called ambulance to bring him to the hospital.He says that he was also holding a bottle while he was skateboarding in the bottle broke but he does not feel a foreign bodies in his lacerations.   Past Medical History:  Diagnosis Date  . Anxiety   . Guillain-Barre syndrome (HCC) 2000  . 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 Obesity 09/17/2015  . Palpitations     Patient Active Problem List   Diagnosis Date Noted  . Anxiety about health 03/05/2016  . 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 Diarrhea 03/02/2016  . Hypokalemia 03/02/2016  . Elevated white blood cell count 03/02/2016  . Weight gain, abnormal 09/17/2015  . Preventative health care 09/17/2015  . Foot pain, left 09/17/2015  .  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 Obesity 09/17/2015  . Screening for STD (sexually transmitted disease) 03/22/2015  . Awareness of heartbeats 06/09/2013  . Anxiety 03/26/2013    History reviewed. No pertinent surgical history.  Prior to Admission medications   Medication Sig Start Date End Date Taking? Authorizing Provider  busPIRone (BUSPAR) 15 MG tablet Take 1 tablet (15 mg total) by mouth 3 (three) times daily. 03/20/16  Yes Lada, Janit Bern, MD  escitalopram (LEXAPRO) 10 MG tablet Take 1 tablet (10 mg total) by mouth daily. 09/17/15  Yes Lada, Janit Bern, MD  hydrOXYzine (ATARAX/VISTARIL) 25 MG tablet TAKE ONE TO TWO TABLETS BY MOUTH EVERY 6 HOURS AS NEEDED 05/19/16  Yes Lada, Janit Bern, MD  propranolol (INDERAL) 20 MG tablet Take 1 tablet (20 mg total) by mouth 3 (three) times daily. 08/15/16  Yes Lada, Janit Bern, MD    Allergies Patient has no known allergies.  Family History  Problem Relation Age of Onset  . Anxiety disorder Mother   . Hypertension Father   . Heart disease Paternal Uncle   . Heart disease Paternal Grandfather     Social History Social History  Substance Use Topics  . Smoking status: Former Smoker    Types: Cigarettes    Quit date: 02/25/2016  . Smokeless tobacco: Former Neurosurgeon    Types: Chew     Comment: smoked 1 pack a week and chewed tobacco until Friday  . Alcohol use No    Review of Systems  Constitutional: No fever/chills Eyes: No visual changes. ENT: No sore throat. Cardiovascular: Denies chest pain. Respiratory: Denies shortness of breath. Gastrointestinal: No abdominal pain.  No nausea, no vomiting.  No diarrhea.  No constipation. Genitourinary: Negative for dysuria. Musculoskeletal: Negative for back pain. Skin: as above Neurological: Negative for headaches, focal weakness or numbness.   ____________________________________________   PHYSICAL EXAM:  VITAL SIGNS: ED Triage Vitals [09/15/16 2106]  Enc Vitals  Group     BP      Pulse      Resp      Temp 98.1 F (36.7 C)     Temp Source Oral     SpO2      Weight      Height      Head Circumference      Peak Flow      Pain Score      Pain Loc      Pain Edu?      Excl. in GC?     Constitutional: Alert and oriented.  in no acute distress. Eyes: Conjunctivae are normal.  Head: Atraumatic. Nose: No congestion/rhinnorhea. Mouth/Throat: Mucous membranes are moist.  Neck: No stridor.   Cardiovascular: Normal rate, regular rhythm. Grossly normal heart sounds.  Good peripheral circulation. Respiratory: Normal respiratory effort.  No retractions. Lungs CTAB. Gastrointestinal: Soft and nontender. No distention.  Musculoskeletal: No lower extremity tenderness nor edema.  No joint effusions. Neurologic:  Normal speech and language.  Skin:  Superficial abrasions to the bilateral flanks without any active bleeding.  Over the right hyperthenar eminence there is a horizontally oriented 4 cm laceration with exposed adipose. There is pulsatile bleeding coming from this laceration that is able to be stopped with firm pressure.  Patient with decreased sensation to the volar aspect of his pinky as well as ring finger on the right hand with incomplete extension of the pinky at the DIP to only about 45. He is also tender to palpation over the fifth metacarpal without any deformity. No wrist tenderness.  3 cm curvilinear laceration over the volar pad of the right thumb without any pulsatile bleeding. Full range of motion to the thumb with sensation that is intact.  Left volar surface of the middle finger on the middle phalanx tuft with a curvilinear 2 cm superficial laceration without any active bleeding. Full range of motion to this finger with sensation that is intact to light touch.  Brisk capillary refill to all digits.  Psychiatric: Mood and affect are normal. Speech and behavior are normal.  ____________________________________________    LABS (all labs ordered are listed, but only abnormal results are displayed)  Labs Reviewed - No data to display ____________________________________________  EKG   ____________________________________________  RADIOLOGY  No foreign body visualized. ____________________________________________   PROCEDURES  Procedure(s) performed:   LACERATION REPAIR Performed by: Arelia Longest Authorized by: Arelia Longest Consent: Verbal consent obtained. Risks and benefits: risks, benefits and alternatives were discussed Consent given by: patient Patient identity confirmed: provided demographic data Prepped and Draped in normal sterile fashion Wound explored  Laceration Location: Right hyperthenar eminence, right thumb, left middle finger  Laceration Length: 4 cm, 3 cm and 2 cm respectively.   No Foreign Bodies seen or palpated  Anesthesia: local infiltration  Local anesthetic: lidocaine 1%   Anesthetic total: 6 ml  Irrigation method: syringe Amount of cleaning: standard  Skin closure: 4-0 nylon   Number of sutures: 4 sutures to the hyperthenar laceration, 3 to the thumb and  2 to the middle finger   Technique: Simple interrupted   Patient tolerance: Patient tolerated the procedure well with no immediate complications.    Procedures  Critical Care performed:   ____________________________________________   INITIAL IMPRESSION / ASSESSMENT AND PLAN / ED COURSE  Pertinent labs & imaging results that were available during my care of the patient were reviewed by me and considered in my medical decision making (see chart for details).  ----------------------------------------- 10:32 PM on 09/15/2016 -----------------------------------------  After the lacerations were sutured a pressure bandage was applied but removed by radiology for the x-ray. The patient then had further bleeding. The bandage was redressed and the patient now has his bleeding  controlled. I also discussed the case with Dr. Martha ClanKrasinski who says that as long as the bleeding is controlled that the patient may follow-up in the office for his neurological defects.    ----------------------------------------- 11:34 PM on 09/15/2016 -----------------------------------------  Pressure dressing was slightly loosened because the patient felt that the wrap was too tight. After loosening the patient was observed for 20 minutes and there was still no further bleeding. The patient was placed in a posterior splint over the hand and proximal forearm to limit motion. Patient is able to range his fingers within the splint and says it does not feel too tight. He has capillary refill is brisk to the fingertips. He has the same sensation that is dull to his small as well as ring finger as when he presented. There is no laceration on the little finger as noted on the x-ray. Possible area of the tract from the site of the laceration. Patient has 2 friends that he will go home with. Patient understands the follow-up plan see the hand surgeon will call this Monday. He understands that he may need a surgical procedure in order to repair the neurologic deficits that he is experiencing at this time. Patient is understanding of the plan one to comply. Does not appear clinically intoxicated at this time.  ____________________________________________   FINAL CLINICAL IMPRESSION(S) / ED DIAGNOSES  Numbness to the right small as well as ring fingers. Laceration to the right palm as well as right thumb. Laceration to the left middle finger. Alcohol intoxication. Abrasions. Fall off a skateboard.    NEW MEDICATIONS STARTED DURING THIS VISIT:  New Prescriptions   No medications on file     Note:  This document was prepared using Dragon voice recognition software and may include unintentional dictation errors.     Myrna BlazerSchaevitz, David Matthew, MD 09/15/16 [phone]

## 2016-09-19 HISTORY — PX: OTHER SURGICAL HISTORY: SHX169

## 2016-10-13 ENCOUNTER — Other Ambulatory Visit: Payer: Self-pay | Admitting: Family Medicine

## 2016-11-15 ENCOUNTER — Other Ambulatory Visit: Payer: Self-pay | Admitting: Family Medicine

## 2017-02-08 ENCOUNTER — Ambulatory Visit: Payer: BC Managed Care – PPO | Admitting: Family Medicine

## 2017-02-08 ENCOUNTER — Encounter: Payer: Self-pay | Admitting: Family Medicine

## 2017-02-08 VITALS — BP 118/68 | HR 74 | Temp 98.0°F | Ht 70.0 in | Wt 206.3 lb

## 2017-02-08 DIAGNOSIS — R002 Palpitations: Secondary | ICD-10-CM | POA: Diagnosis not present

## 2017-02-08 DIAGNOSIS — R9431 Abnormal electrocardiogram [ECG] [EKG]: Secondary | ICD-10-CM

## 2017-02-08 DIAGNOSIS — F1921 Other psychoactive substance dependence, in remission: Secondary | ICD-10-CM | POA: Diagnosis not present

## 2017-02-08 DIAGNOSIS — F418 Other specified anxiety disorders: Secondary | ICD-10-CM | POA: Diagnosis not present

## 2017-02-08 NOTE — Patient Instructions (Addendum)
Your cardiologist appointment is 02/13/2017 at 4:40pm, please arrive at 4:20pm. You will be seeing Dr.Arida. The address is [address], [place], [place] [address]  If you have not heard anything from my staff in a week about any orders/referrals/studies from today, please contact us here to follow-up [phone]  I will really encourage you to avoid caffeine, tea, chocolate, and alcohol  I think your symptoms are consistent with SVT, but I am not diagnosing you with that -- we'll let the cardiologist see you and evaluate you   Steps to Elicit the Relaxation Response The following is the technique reprinted with permission from Dr. Billy Fischer book The Relaxation Response pages 162-163 1. Sit quietly in a comfortable position. 2. Close your eyes. 3. Deeply relax all your muscles,  beginning at your feet and progressing up to your face.  Keep them relaxed. 4. Breathe through your nose.  Become aware of your breathing.  As you breathe out, say the word, "one"*,  silently to yourself. For example,  breathe in ... out, "one",- in .. out, "one", etc.  Breathe easily and naturally. 5. Continue for 10 to 20 minutes.  You may open your eyes to check the time, but do not use an alarm.  When you finish, sit quietly for several minutes,  at first with your eyes closed and later with your eyes opened.  Do not stand up for a few minutes. 6. Do not worry about whether you are successful  in achieving a deep level of relaxation.  Maintain a passive attitude and permit relaxation to occur at its own pace.  When distracting thoughts occur,  try to ignore them by not dwelling upon them  and return to repeating "one."  With practice, the response should come with little effort.  Practice the technique once or twice daily,  but not within two hours after any meal,  since the digestive processes seem to interfere with  the elicitation of the Relaxation Response. * It is better to  use a soothing, mellifluous sound, preferably with no meaning. or association, to avoid stimulation of unnecessary thoughts - a mantra.   Supraventricular Tachycardia, Adult Supraventricular tachycardia (SVT) is a type of abnormal heart rhythm. It causes the heart to beat very quickly and then return to a normal speed. A normal heart rate is 60-100 beats per minute. During an episode of SVT, your heart rate may be higher than 150 beats per minute. Episodes of SVT can be frightening, but they are usually not dangerous. However, if episodes happens often or last for long periods of time, they may lead to heart failure. What are the causes? Usually, a normal heartbeat starts when an area called the sinoatrial node releases an electrical signal. In SVT, other areas of the heart send out electrical signals that interfere with the signal from the sinoatrial node. It is not known why some people get SVT and others do not. What increases the risk? This condition is more likely to develop in:  People who are 42?22 years old.  Women.  Factors that may increase your chances of an attack include:  Stress.  Tiredness.  Smoking.  Stimulant drugs, such as cocaine and methamphetamine.  Alcohol.  Caffeine.  Pregnancy.  Anxiety.  What are the signs or symptoms? Symptoms of this condition include:  A pounding heart.  A feeling that the heart is skipping beats (palpitations).  Weakness.  Shortness of breath.  Tightness or pain in your chest.  Light-headedness.  Anxiety.  Dizziness.  Sweating.  Nausea.  Fainting.  Fatigue or tiredness.  A mild episode may not cause symptoms. How is this diagnosed? This condition may be diagnosed based on:  Your symptoms.  A physical exam. If you are have an episode of SVT during the exam, the health care provider may be able to diagnose SVT by listening to your heart and feeling your pulse.  Tests. These may include: ? An  electrocardiogram (ECG). This test is done to check for problems with electrical activity in the heart. ? A Holter monitor or event monitor test. This test involves wearing a portable device that monitors your heart rate over time. ? An echocardiogram. This test involves taking an image of your heart using sound waves. It is done to rule out other causes of a fast heart rate. ? Blood tests.  How is this treated? This condition may be treated with:  Vagal nerve stimulation. The treatment involves stimulating your vagus nerve, which slows down the heart. It is often the first and only treatment that is needed for this condition. It is a good idea to try the several ways of doing vagal stimulation to find which one works best for you. Ways to do this treatment include: ? Holding your breath and pushing, as though you are having a bowel movement. ? Massaging an area on one side of your neck, below your jaw. Do not try this yourself. Only a health care provider should do this. If done the wrong way, it can lead to a stroke. ? Bending forward with your head between your legs. ? Coughing while bending forward with your head between your legs. ? Closing your eyes and massaging your eyeballs. A health care provider should guide you through this method before you try it on your own.  Medicines that prevent attacks.  Medicine to stop an attack. The medicine is given through an IV tube at the hospital.  A small electric shock (cardioversion) that stops an attack. Before you get the shock, you will get medicine to make you fall asleep.  Radiofrequency ablation. In this procedure, a small, thin tube (catheter) is used to send radiofrequency energy to the area of tissue that is causing the rapid heartbeats. The energy kills the cells and helps your heart keep a normal rhythm. You may have this treatment if you have symptoms of SVT often.  If you do not have symptoms, you may not need treatment. Follow these  instructions at home: Stress  Avoid stressful situations when possible.  Find healthy ways of managing stress that work for you. Some healthy ways to manage stress include: ? Taking part in relaxing activities, such as yoga, meditation, or being out in nature. ? Listening to relaxing music. ? Practicing relaxation techniques, such as deep breathing. ? Leading a healthy lifestyle. This involves getting plenty of sleep, exercising, and eating a balanced diet. ? Attending counseling or talk therapy with a mental health professional. Sleep  Try to get at least 7 hours of sleep each night. Tobacco and nicotine  Do not use any products that contain nicotine or tobacco, such as cigarettes and e-cigarettes. If you need help quitting, ask your health care provider. Alcohol  If alcohol triggers episodes of SVT, do not drink alcohol.  If alcohol does not seem to trigger episodes, limit alcohol intake to no more than 1 drink a day for nonpregnant women and 2 drinks a day for men. One drink equals 12 oz  of beer, 5 oz of wine, or 1 oz of hard liquor. Caffeine  If caffeine triggers episodes of SVT, do not eat, drink, or use anything with caffeine in it.  If caffeine does not seem to trigger episodes, consume caffeine in moderation. Stimulant drugs  Do not use stimulant drugs. If you need help quitting, talk with your health care provider. General instructions  Maintain a healthy weight.  Exercise regularly. Ask your health care provider to suggest some good activities for you. Aim for one or a combination of the following: ? 150 minutes per week of moderate exercise, such as walking or yoga. ? 75 minutes per week of vigorous exercise, such as running or swimming.  Perform vagus nerve stimulation as directed by your health care provider.  Take over-the-counter and prescription medicines only as told by your health care provider. Contact a health care provider if:  You have episodes of SVT  more often than before.  Episodes of SVT last longer than before.  Vagus nerve stimulation is no longer helping.  You have new symptoms. Get help right away if:  You have chest pain.  Your symptoms get worse.  You have trouble breathing.  You have an episode of SVT that lasts longer than 20 minutes.  You faint. These symptoms may represent a serious problem that is an emergency. Do not wait to see if the symptoms will go away. Get medical help right away. Call your local emergency services (911 in the [place] Do not drive yourself to the hospital. This information is not intended to replace advice given to you by your health care provider. Make sure you discuss any questions you have with your health care provider. Document Released: 02/13/2005 Document Revised: 10/21/2015 Document Reviewed: 10/21/2015 Elsevier Interactive Patient Education  2017 [employer]

## 2017-02-08 NOTE — Assessment & Plan Note (Signed)
Will avoid benzodiazepines completely

## 2017-02-08 NOTE — Assessment & Plan Note (Addendum)
Chart reviewed, including cardiologist note and telephone notes from 2015 [facility]); EKG done today, compared to previous, really unchanged; no chest pain whatsoever so I do not trust the computer interpretation; faxed EKG to cardiologist; suspect his symptoms are SVT; some information was given to patient, though I explained that I am NOT diagnosing him with SVT; he is more anxious not having an answer; paroxysmal atrial fibrillation is less likely in his age without structural heart disease, and he has already undergone echocardiography, reviewed report

## 2017-02-08 NOTE — Progress Notes (Signed)
BP 118/68 (BP Location: Left Arm, Patient Position: Sitting, Cuff Size: Large)   Pulse 74   Temp 98 F (36.7 C) (Oral)   Ht 5\' 10"  (1.778 m)   Wt 206 lb 4.8 oz (93.6 kg)   SpO2 98%   BMI 29.60 kg/m    Subjective:    Patient ID: Jacob Peters, male    DOB: 05-25-1995, 21 y.o.   MRN: [id number]  HPI: Jacob Peters is a 21 y.o. male  Chief Complaint  Patient presents with  . Palpitations    Pt states that he has been doing research and wants to discuss possible a-fib dx   . Referral    HPI Patient is here for an acute visit; his last visit with me was in January He has been doing reading online and wants to see if he has atrial fibrillation His heart would race to like 200 bpm with exercise, irregular and fast with exercise; felt like something would click, like an explosive movement and that would start it; the only thing that will stop it is cold water; valsalva does not stop it as reliably as the cold water splashed to the back neck With these episodes, has frequent urination He has been hyper aware of his heart ever since then There are times where his heart feels off, just different; if he drinks alcohol, then the morning after when dehydrated, his heart feels different; not drinking much alcohol usually, but 8 beers if out partying on a weekend No illicits or recreational substances He uses the propranolol and that helps with anxiety too; 99% of his anxiety comes from his heart; helps with the anxiety but also the palitations and heart beat; he takes it when he first wakes up; at first upon awakening, it feels stronger and uncomfortable No chest pain; SHOB with the fast heart rate; goes up after eating; worse if laying and then rotating or getting up and then will go from 70 to 120 bpm No family hx of WPW or SVT or pacemaker [facility] Normal CXR Jan 2018  He would like a referral Chart reviewed; previous provider thought palpitations were  likely due to anxiety; started him on propranolol, and discussed putting him on a Zio patch He saw a [facility]ologist, [facility] Smith Rober[facility]; note reviewed; in summary, he was having fast heart beat precipitated by exercise, anxiety, overeating, and waking from a nap; palpitations such as skipped beat or premature beat usually with exertion He had [facility] prior (in [facility]) as well [facility]; no diagnosis of organic heart disease was found per Dr. Assunta Gamblesao's note; physical exam was normal; she had hoped to see him 4 weeks later, but he did not return; Zio patch was indeed returned and was reported as "normal" per telephone note dated 07/31/2013  I saw him back in January for similar symptoms; his TSH was normal at 0.56 He had a normal 5-HIAA test, normal CBC, normal Mg2+, normal K+  Since his last visit, he lacerated his right hand (July 2018) and has been going to ortho and OT; doing a whole lot better; right-handed (dominant hand)  He has significant anxiety; currently taking lexapro and buspirone  He has a hx of benzo and alcohol addiction  Depression screen [facility] 2[facility] 02/08/2017 03/20/2016 03/02/2016 09/17/2015 03/22/2015  Decreased Interest 2 1 2  0 0  Down, Depressed, Hopeless 2 0 2 0 1  PHQ - 2 Score 4 1 4  0 1  Altered sleeping 2 0 2 - -  Tired, decreased energy 2 0 2 - -  Change in appetite 0 0 3 - -  Feeling bad or failure about yourself  2 0 0 - -  Trouble concentrating - 0 2 - -  Moving slowly or fidgety/restless 0 0 1 - -  Suicidal thoughts 0 0 0 - -  PHQ-9 Score 10 1 14  - -  Difficult doing work/chores Somewhat difficult - Extremely dIfficult - -    Relevant past medical, surgical, family and social history reviewed Past Medical History:  Diagnosis Date  . Anxiety   . Guillain-Barre syndrome (HCC) 2000  . 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 Obesity  09/17/2015  . Palpitations    Past Surgical History:  Procedure Laterality Date  . ulnara nerve laceration exploration and nerve repair Right 09/19/2016   Family History  Problem Relation Age of Onset  . Anxiety disorder Mother   . Hypertension Father   . Diabetes Father   . Heart disease Paternal Uncle   . Heart disease Paternal Grandfather    Social History   Tobacco Use  . Smoking status: Current Every Day Smoker    Packs/day: 1.00    Types: Cigarettes  . Smokeless tobacco: Current User    Types: Chew  . Tobacco comment: smoked 1 pack a week and chewed tobacco until Friday  Substance Use Topics  . Alcohol use: No    Alcohol/week: 0.0 oz  . Drug use: No    Interim medical history since last visit reviewed. Allergies and medications reviewed  Review of Systems Per HPI unless specifically indicated above     Objective:    BP 118/68 (BP Location: Left Arm, Patient Position: Sitting, Cuff Size: Large)   Pulse 74   Temp 98 F (36.7 C) (Oral)   Ht 5\' 10"  (1.778 m)   Wt 206 lb 4.8 oz (93.6 kg)   SpO2 98%   BMI 29.60 kg/m   Wt Readings from Last 3 Encounters:  02/08/17 206 lb 4.8 oz (93.6 kg)  03/20/16 224 lb 4 oz (101.7 kg)  03/02/16 224 lb (101.6 kg)    Physical Exam  Constitutional: He appears well-developed and well-nourished. No distress.  Weight loss 18 pounds over last 11 months  Eyes: No scleral icterus.  Cardiovascular: Normal rate and regular rhythm.  No extrasystoles are present. PMI is not displaced. Exam reveals no gallop.  No murmur heard. Pulmonary/Chest: Effort normal and breath sounds normal.  Neurological: He is alert. He displays no tremor.  Skin: He is not diaphoretic. No pallor.  No facial flushing  Psychiatric: His mood appears anxious. His speech is not rapid and/or pressured and not tangential. He is not agitated and not hyperactive. He does not express impulsivity. He does not exhibit a depressed mood. He expresses no homicidal and no  suicidal ideation.   Results for orders placed or performed in visit on 03/08/16  5 HIAA, quantitative, urine, 24 hour  Result Value Ref Range   5-HIAA, 24 Hr Urine 3.2 <=6.0 mg/24 h      Assessment & Plan:   Problem List Items Addressed This Visit      Other   Medication addiction in remission [facility])    Will avoid benzodiazepines completely      Relevant Orders   Ambulatory referral  to Psychiatry   Palpitations    Chart reviewed, including cardiologist note and telephone notes from [facility]); EKG done today, compared to previous, really unchanged; no chest pain whatsoever so I do not trust the computer interpretation; faxed EKG to cardiologist; suspect his symptoms are SVT; some information was given to patient, though I explained that I am NOT diagnosing him with SVT; he is more anxious not having an answer; paroxysmal atrial fibrillation is less likely in his age without structural heart disease, and he has already undergone echocardiography, reviewed report      Relevant Orders   Ambulatory referral to Cardiology   EKG 12-Lead   Ambulatory referral to Psychiatry   Anxiety about health    Patient thinks 95% or more of his anxiety is related to his heart and awareness of abnormal / rapid heart beats; he thinks if he finds a reason and that it isn't going to be fatal, that will give him significant reassurance; he is still interested in seeing a psychiatrist for his anxiety; will NOT treat with benzos given his hx of addiction      Relevant Orders   Ambulatory referral to Psychiatry    Other Visit Diagnoses    Abnormal EKG    -  Primary   Relevant Orders   Ambulatory referral to Cardiology   EKG 12-Lead       Follow up plan: No Follow-up on file.  An after-visit summary was printed and given to the patient at check-out.  Please see the patient instructions which may contain other information and recommendations beyond what is mentioned above in the assessment and  plan.  No orders of the defined types were placed in this encounter.   Orders Placed This Encounter  Procedures  . Ambulatory referral to Cardiology  . Ambulatory referral to Psychiatry  . EKG 12-Lead

## 2017-02-09 NOTE — Assessment & Plan Note (Signed)
Patient thinks 95% or more of his anxiety is related to his heart and awareness of abnormal / rapid heart beats; he thinks if he finds a reason and that it isn't going to be fatal, that will give him significant reassurance; he is still interested in seeing a psychiatrist for his anxiety; will NOT treat with benzos given his hx of addiction

## 2017-02-13 ENCOUNTER — Ambulatory Visit (INDEPENDENT_AMBULATORY_CARE_PROVIDER_SITE_OTHER): Payer: BC Managed Care – PPO | Admitting: Cardiovascular Disease

## 2017-02-13 ENCOUNTER — Encounter: Payer: Self-pay | Admitting: Cardiovascular Disease

## 2017-02-13 VITALS — BP 114/84 | HR 71 | Ht 70.0 in | Wt 203.5 lb

## 2017-02-13 DIAGNOSIS — R002 Palpitations: Secondary | ICD-10-CM | POA: Diagnosis not present

## 2017-02-13 DIAGNOSIS — Z7689 Persons encountering health services in other specified circumstances: Secondary | ICD-10-CM | POA: Diagnosis not present

## 2017-02-13 NOTE — Patient Instructions (Signed)
Medication Instructions: No changes.   Labwork: None.   Procedures/Testing: None.   Follow-Up: 6 months with Dr. Arida.   Any Additional Special Instructions Will Be Listed Below (If Applicable).     If you need a refill on your cardiac medications before your next appointment, please call your pharmacy.   

## 2017-02-13 NOTE — Progress Notes (Signed)
Cardiology Office Note   Date:  02/13/2017   ID:  Jacob SmartGrant A Vicuna, DOB Sep 06, 1995, MRN [id number]  PCP:  Kerman PasseyLada, Melinda P, MD  Cardiologist:   Lorine BearsMuhammad Vanissa Strength, MD   Chief Complaint  Patient presents with  . OTHER    Heart palpitations. Meds reviewed verbally with pt.      History of Present Illness: Jacob Peters is a 21 y.o. male who was referred by Dr. Sherie DonLada for evaluation of palpitations.  The patient has prolonged history of palpitations, tachycardia in the setting of severe anxiety.  [facility] which was nonrevealing.  He had an echocardiogram in 2012 which was normal.  He had outpatient monitoring performed which showed sinus tachycardia.  The patient was ultimately started on small dose of Inderal 3 years ago with some improvement in his symptoms. He is now concerned about atrial fibrillation.  He reports that his heart rate increases up to 200 bpm with exercise.  Heart rate goes down within 2-3 minutes after he stopped exercising.  It goes down quicker if he puts cold water on the back of his head.  He feels that his heart rate increases too high when he is dehydrated or anxious.  No syncope or presyncope.  No chest pain or significant dyspnea.    Past Medical History:  Diagnosis Date  . Anxiety   . Guillain-Barre syndrome (HCC) 2000  . 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 Obesity 09/17/2015  . Palpitations     Past Surgical History:  Procedure Laterality Date  . ulnara nerve laceration exploration and nerve repair Right 09/19/2016     Current Outpatient Medications  Medication Sig Dispense Refill  . busPIRone (BUSPAR) 15 MG tablet TAKE ONE TABLET BY MOUTH THREE TIMES DAILY 90 tablet 0  . escitalopram (LEXAPRO) 10 MG tablet TAKE ONE TABLET BY MOUTH ONCE DAILY 30 tablet 3  . propranolol (INDERAL) 20 MG tablet Take 1 tablet  (20 mg total) by mouth 3 (three) times daily. 90 tablet 11   No current facility-administered medications for this visit.     Allergies:   Patient has no known allergies.    Social History:  The patient  reports that he has been smoking cigarettes.  He has a 5.00 pack-year smoking history. His smokeless tobacco use includes chew. He reports that he drinks alcohol. He reports that he does not use drugs.   Family History:  The patient's family history includes Anxiety disorder in his mother; Atrial fibrillation in his maternal grandmother; Diabetes in his father; Heart disease in his paternal grandfather and paternal uncle; Hypertension in his father.    ROS:  Please see the history of present illness.   Otherwise, review of systems are positive for none.   All other systems are reviewed and negative.    PHYSICAL EXAM: VS:  BP 114/84 (BP Location: Right Arm, Patient Position: Sitting, Cuff Size: Normal)   Pulse 71   Ht 5\' 10"  (1.778 m)   Wt 203 lb 8 oz (92.3 kg)   BMI 29.20 kg/m  , BMI Body mass index is 29.2 kg/m. GEN: Well nourished, well developed, in no acute distress  HEENT: normal  Neck: no JVD, carotid bruits, or masses Cardiac: RRR; no murmurs, rubs, or gallops,no edema  Respiratory:  clear to auscultation bilaterally, normal work of breathing GI: soft, nontender,  nondistended, + BS MS: no deformity or atrophy  Skin: warm and dry, no rash Neuro:  Strength and sensation are intact Psych: euthymic mood, full affect   EKG:  EKG is ordered today. The ekg ordered today demonstrates normal sinus rhythm with early repolarization which is a benign finding.   Recent Labs: 02/25/2016: ALT 28 03/08/2016: BUN 7; Creat 1.10; Hemoglobin 16.2; Magnesium 2.1; Platelets 350; Potassium 4.3; Sodium 137; TSH 0.56    Lipid Panel    Component Value Date/Time   CHOL 150 09/17/2015 1345   TRIG 91 09/17/2015 1345   HDL 40 09/17/2015 1345   CHOLHDL 3.8 09/17/2015 1345   VLDL 18  09/17/2015 1345   LDLCALC 92 09/17/2015 1345      Wt Readings from Last 3 Encounters:  02/13/17 203 lb 8 oz (92.3 kg)  02/08/17 206 lb 4.8 oz (93.6 kg)  03/20/16 224 lb 4 oz (101.7 kg)        PAD Screen 02/13/2017  Previous PAD dx? No  Previous surgical procedure? Yes  Dates of procedures Right hand  Pain with walking? No  Subsides with rest? No  Feet/toe relief with dangling? No  Painful, non-healing ulcers? No  Extremities discolored? Yes      ASSESSMENT AND PLAN:  1.  Palpitations: Likely due to sinus tachycardia in the setting of anxiety.  The patient had extensive workup in the past which was nonrevealing.  Based on his physical exam and normal EKG, I do not suspect significant arrhythmia.  He reports that monitoring in the past it did not capture her arrhythmia because he did not have episodes while he was wearing the monitor. I I discussed with him the option of purchasing his own smart phone monitor which is available commercially.  This way he can have the monitor with him all the time and record any suspicious events.  He is highly interested in this and he is going to purchase this. Otherwise continue treatment with propranolol.    Disposition:   FU with me in 6 months  Signed,  Lorine BearsMuhammad Nevah Dalal, MD  02/13/2017 4:14 PM    [facility]

## 2017-02-21 ENCOUNTER — Other Ambulatory Visit: Payer: Self-pay | Admitting: Family Medicine

## 2017-02-22 ENCOUNTER — Other Ambulatory Visit: Payer: Self-pay

## 2017-02-22 MED ORDER — ESCITALOPRAM OXALATE 10 MG PO TABS: 10.0000 mg | ORAL_TABLET | Freq: Every day | ORAL | 0 refills | Status: DC

## 2017-03-27 ENCOUNTER — Other Ambulatory Visit: Payer: Self-pay | Admitting: Family Medicine

## 2017-03-27 NOTE — Telephone Encounter (Signed)
Please find out if patient has seen psychiatrist yet I referred him back in December and don't want two physicians managing his anxiety; there should just be one prescriber; thank you

## 2017-03-27 NOTE — Telephone Encounter (Signed)
Please verify with patient that he is seeing psychiatrist We only want one prescriber of his anxiety medicine Thank you

## 2017-03-28 NOTE — Telephone Encounter (Signed)
FYI

## 2017-03-28 NOTE — Telephone Encounter (Addendum)
Patient called [phone], left VM asking if the patient is following up with a psychiatrist for his anxiety meds buspar and lexapro, as noted by Dr. Sherie DonLada 03/27/17. Asked patient to return call to the office with an answer so that Dr. Sherie DonLada can be made aware.

## 2017-03-28 NOTE — Telephone Encounter (Signed)
Copied from CRM [id number]. Topic: Quick Communication - See Telephone Encounter >> Mar 28, 2017  2:49 PM Arlyss Gandyichardson, Dimetrius Montfort N, NT wrote: CRM for notification. See Telephone encounter for: Pt states the pharmacy faxed over a request for a refill of Lexapro and Buspar yesterday and he was checking status on the refill. Did inform pt med refills can take 3 business days. Uses [facility] [address]   03/28/17.

## 2017-03-29 MED ORDER — PROPRANOLOL HCL 20 MG PO TABS: 20.0000 mg | ORAL_TABLET | Freq: Three times a day (TID) | ORAL | 11 refills | Status: DC

## 2017-03-29 MED ORDER — BUSPIRONE HCL 15 MG PO TABS: 15.0000 mg | ORAL_TABLET | Freq: Two times a day (BID) | ORAL | 0 refills | Status: DC

## 2017-03-29 MED ORDER — ESCITALOPRAM OXALATE 10 MG PO TABS: 10.0000 mg | ORAL_TABLET | Freq: Every day | ORAL | 0 refills | Status: DC

## 2017-03-29 NOTE — Telephone Encounter (Signed)
FYI

## 2017-03-29 NOTE — Telephone Encounter (Signed)
Copied from CRM [phone]. Topic: Quick Communication - See Telephone Encounter >> Mar 28, 2017  2:49 PM Arlyss Gandyichardson, Taren N, NT wrote: CRM for notification. See Telephone encounter for: Pt states the pharmacy faxed over a request for a refill of Lexapro and Buspar yesterday and he was checking status on the refill. Did inform pt med refills can take 3 business days. Uses [facility] [address]   03/28/17. >> Mar 29, 2017  9:06 AM Rudi CocoLathan, Jereline Ticer M, NT wrote: Pt. Mother called again to request that pt. Gets meds. Today pt. Is completely out of meds.

## 2017-03-29 NOTE — Telephone Encounter (Signed)
Refills sent; hope he sees psych soon

## 2017-03-29 NOTE — Telephone Encounter (Signed)
Received call from pt's mother, who states that this pt is not getting medications from anyone else currently. She states that the pt has an appt w/ psychiatrist but not until February. They are asking that you please fill meds until they can get in with psych. Pt states that pt is already noticing changes in mood without meds. Please advise.

## 2017-04-02 ENCOUNTER — Ambulatory Visit (INDEPENDENT_AMBULATORY_CARE_PROVIDER_SITE_OTHER): Payer: BC Managed Care – PPO | Admitting: Psychiatry

## 2017-04-02 ENCOUNTER — Encounter: Payer: Self-pay | Admitting: Psychiatry

## 2017-04-02 ENCOUNTER — Other Ambulatory Visit: Payer: Self-pay

## 2017-04-02 VITALS — BP 144/94 | HR 62 | Temp 97.7°F | Wt 197.8 lb

## 2017-04-02 DIAGNOSIS — F1010 Alcohol abuse, uncomplicated: Secondary | ICD-10-CM

## 2017-04-02 DIAGNOSIS — F331 Major depressive disorder, recurrent, moderate: Secondary | ICD-10-CM

## 2017-04-02 DIAGNOSIS — F411 Generalized anxiety disorder: Secondary | ICD-10-CM

## 2017-04-02 NOTE — Progress Notes (Signed)
Psychiatric Initial Adult Assessment   Patient Identification: Jacob Peters MRN:  [id number] Date of Evaluation:  04/02/2017 Referral Source: Dr.Lada Chief Complaint:  Alcohol abuse Chief Complaint    Establish Care; Anxiety; Depression     Visit Diagnosis:    ICD-10-CM   1. GAD (generalized anxiety disorder) F41.1   2. Alcohol abuse F10.10   3. MDD (major depressive disorder), recurrent episode, moderate (HCC) F33.1     History of Present Illness:  Patient is a 22 year old Caucasian male seen today for on evaluation and treatment of anxiety, depression and alcohol abuse. Patient came in by his himself today and was self-referred. Reports that he had a D UI recently and [facility]. Patient reports a long-standing history of depression and anxiety since an early age. He has been treated by psychiatrist a few years ago and reports that he was put on Klonopin and the BuSpar and Lexapro. States that at the time which was 3 years ago in 2015 he was taking up to 3 mg of Klonopin to point where he became dependent on it. Patient is a bit weak about on if he developed a dependence on the Klonopin. However he was also in rehabilitation for use of  benzodiazepines.  Patient reports that he did go to [employer] for college in the first year he was quite depressed and anxious and states that that led to more drinking. It led to his first DUI and he came back home to [place]. Patient was born and raised in [place] by his biological parents. Parents separated when he was 10 years old and since then he has been living with both parents in a shared custody arrangement. Patient reports that he has been seeing therapists for depression and anxiety since a very early age. The last time he saw a psychiatrist was in 2015. Most recently he was being seen by his primary care physician Dr. Luna Glasgow who prescribed his BuSpar and Lexapro. Currently he reports taking the BuSpar at 15  mg once daily and Lexapro at 10 mg. Patient reports that after his rehabilitation in 2015 he was sober for 2 whole years after which he started slowly drinking and he came a problem more recently.         Past Psychiatric History: Patient has been in rehabilitation before and has seen a psychiatrist and therapist since an early age.  Previous Psychotropic Medications: Yes   Substance Abuse History in the last 12 months:  Yes.    Consequences of Substance Abuse: Legal Consequences:  previous dui, license lost Family Consequences:  conflict with his father  Past Medical History:  Past Medical History:  Diagnosis Date  . Anxiety   . Depression   . Guillain-Barre syndrome (HCC) 2000  . 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 Obesity 09/17/2015  . Palpitations     Past Surgical History:  Procedure Laterality Date  . HAND SURGERY Right   . ulnara nerve laceration exploration and nerve repair Right 09/19/2016    Family Psychiatric History: see below  Family History:  Family History  Problem Relation Age of Onset  . Anxiety disorder Mother   . Hypertension Father   . Diabetes Father   . Anxiety disorder Sister   . Heart disease Paternal Uncle   . Atrial fibrillation Maternal Grandmother   . Alcohol abuse Maternal Grandmother   .  Heart disease Paternal Grandfather     Social History:   Social History   Socioeconomic History  . Marital status: Single    Spouse name: None  . Number of children: 0  . Years of education: None  . Highest education level: High school graduate  Social Needs  . Financial resource strain: Hard  . Food insecurity - worry: Never true  . Food insecurity - inability: Never true  . Transportation needs - medical: Yes  . Transportation needs - non-medical: Yes  Occupational History    Comment: part time  Tobacco Use  . Smoking status:  Current Every Day Smoker    Packs/day: 1.00    Years: 5.00    Pack years: 5.00    Types: Cigarettes  . Smokeless tobacco: Current User    Types: Chew  . Tobacco comment: smoked 1 pack a week and chewed tobacco until Friday  Substance and Sexual Activity  . Alcohol use: Yes    Alcohol/week: 18.0 oz    Types: 30 Cans of beer per week    Comment: occasional  . Drug use: No  . Sexual activity: Yes    Birth control/protection: None, Condom  Other Topics Concern  . None  Social History Narrative  . None    Additional Social History: currently staying with his mother  Allergies:  No Known Allergies  Metabolic Disorder Labs: No results found for: HGBA1C, MPG No results found for: PROLACTIN Lab Results  Component Value Date   CHOL 150 09/17/2015   TRIG 91 09/17/2015   HDL 40 09/17/2015   CHOLHDL 3.8 09/17/2015   VLDL 18 09/17/2015   LDLCALC 92 09/17/2015     Current Medications: Current Outpatient Medications  Medication Sig Dispense Refill  . busPIRone (BUSPAR) 15 MG tablet Take 1 tablet (15 mg total) by mouth 2 (two) times daily. 60 tablet 0  . escitalopram (LEXAPRO) 10 MG tablet Take 1 tablet (10 mg total) by mouth daily. 30 tablet 0  . propranolol (INDERAL) 20 MG tablet Take 1 tablet (20 mg total) by mouth 3 (three) times daily. 90 tablet 11   No current facility-administered medications for this visit.     Neurologic: Headache: Negative Seizure: No Paresthesias:No  Musculoskeletal: Strength & Muscle Tone: within normal limits Gait & Station: normal Patient leans: N/A  Psychiatric Specialty Exam: ROS  Blood pressure (!) 144/94, pulse 62, temperature 97.7 F (36.5 C), temperature source Oral, weight 89.7 kg (197 lb 12.8 oz).Body mass index is 28.38 kg/m.  General Appearance: Casual  Eye Contact:  Fair  Speech:  Clear and Coherent  Volume:  Normal  Mood:  Anxious and Depressed  Affect:  Congruent  Thought Process:  Coherent  Orientation:  Full (Time,  Place, and Person)  Thought Content:  Logical  Suicidal Thoughts:  No  Homicidal Thoughts:  No  Memory:  Immediate;   Fair Recent;   Fair Remote;   Fair  Judgement:  Fair  Insight:  Present  Psychomotor Activity:  Normal  Concentration:  Concentration: Fair and Attention Span: Fair  Recall:  [employer]Fair  Fund of Knowledge:Fair  Language: Fair  Akathisia:  No  Handed:  Right  AIMS (if indicated):  na  Assets:  Communication Skills Desire for Improvement Physical Health Resilience Social Support  ADL's:  Intact  Cognition: WNL  Sleep:  fair    Treatment Plan Summary:  Alcohol dependence [facility] starting this Wednesday. discussed with patient  and his mother in great detail about the need for an aftercare program. Several suggestions were made including following up with the IOP program at [facility] Patient and mom report that they will also be given  other options during his stay at [facility]. We discussed that patient will follow up for 1 more appointment with this clinician and then transition to whichever aftercare program he chooses to go.  Generalized anxiety disorder Patient is currently taking BuSpar at 15 mg daily, recommend that he increase it to twice daily Patient is currently taking Lexapro 10 mg daily. Further changes at the discretion of the psychiatrist at [facility].  Major depressive disorder As above  Patient is to return for a follow-up appointment in 6 weeks time or call before if needed   Patrick North, MD 2/4/20191:02 PM

## 2017-05-01 NOTE — Telephone Encounter (Signed)
Please have patient request his psychiatry medicines from his psychiatrist going forward; thank you

## 2017-05-02 NOTE — Telephone Encounter (Signed)
Left detailed voicemial 

## 2017-05-15 ENCOUNTER — Other Ambulatory Visit: Payer: Self-pay

## 2017-05-15 ENCOUNTER — Ambulatory Visit: Payer: BC Managed Care – PPO | Admitting: Psychiatry

## 2017-05-15 ENCOUNTER — Encounter: Payer: Self-pay | Admitting: Psychiatry

## 2017-05-15 VITALS — BP 130/81 | HR 56 | Temp 97.3°F | Wt 204.0 lb

## 2017-05-15 DIAGNOSIS — F1010 Alcohol abuse, uncomplicated: Secondary | ICD-10-CM | POA: Diagnosis not present

## 2017-05-15 DIAGNOSIS — F411 Generalized anxiety disorder: Secondary | ICD-10-CM | POA: Diagnosis not present

## 2017-05-15 MED ORDER — BUSPIRONE HCL 7.5 MG PO TABS: 7.5000 mg | ORAL_TABLET | Freq: Two times a day (BID) | ORAL | 1 refills | Status: DC

## 2017-05-15 MED ORDER — ESCITALOPRAM OXALATE 20 MG PO TABS: 20.0000 mg | ORAL_TABLET | Freq: Every day | ORAL | 1 refills | Status: DC

## 2017-05-15 NOTE — Progress Notes (Signed)
Psychiatric progress note  Patient Identification: Jacob Peters MRN:  [id number] Date of Evaluation:  05/15/2017 Referral Source: Dr.Lada Chief Complaint:  Alcohol abuse Chief Complaint    Follow-up; Medication Refill     Visit Diagnosis:    ICD-10-CM   1. GAD (generalized anxiety disorder) F41.1   2. Alcohol abuse F10.10     History of Present Illness:  Patient is a 22 year old Caucasian male seen today for follow-up of alcohol dependence and anxiety. Patient was seen last month for his first appointment at which time he was about to go to [facility] for a 28 day residential treatment for alcohol abuse. Today he reports that he is at [facility] house in [place] and has been out of the program for 2 weeks. Reports that the program was very beneficial as he realized that he was using different kinds of excuses to pick up a drink.States that he seeing a therapist in [place] on a weekly basis and trying to look for a job. Sleeping well and eating well. Denies any mood symptoms and anxiety symptoms. He is attending a meeting a day. Denies any suicidal thoughts.  Past Psychiatric History: Patient has been in rehabilitation before and has seen a psychiatrist and therapist since an early age.  Previous Psychotropic Medications: Yes   Substance Abuse History in the last 12 months:  Yes.    Consequences of Substance Abuse: Legal Consequences:  previous dui, license lost Family Consequences:  conflict with his father  Past Medical History:  Past Medical History:  Diagnosis Date  . Anxiety   . Depression   . Guillain-Barre syndrome (HCC) 2000  . 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 Obesity 09/17/2015  . Palpitations     Past Surgical History:  Procedure Laterality Date  . HAND SURGERY Right   . ulnara nerve laceration exploration and nerve repair Right 09/19/2016    Family  Psychiatric History: see below  Family History:  Family History  Problem Relation Age of Onset  . Anxiety disorder Mother   . Hypertension Father   . Diabetes Father   . Anxiety disorder Sister   . Heart disease Paternal Uncle   . Atrial fibrillation Maternal Grandmother   . Alcohol abuse Maternal Grandmother   . Heart disease Paternal Grandfather     Social History:   Social History   Socioeconomic History  . Marital status: Single    Spouse name: None  . Number of children: 0  . Years of education: None  . Highest education level: High school graduate  Social Needs  . Financial resource strain: Hard  . Food insecurity - worry: Never true  . Food insecurity - inability: Never true  . Transportation needs - medical: Yes  . Transportation needs - non-medical: Yes  Occupational History    Comment: part time  Tobacco Use  . Smoking status: Current Every Day Smoker    Packs/day: 1.00    Years: 5.00    Pack years: 5.00    Types: Cigarettes  . Smokeless tobacco: Current User    Types: Chew  . Tobacco comment: smoked 1 pack a week and chewed tobacco until Friday  Substance and Sexual Activity  . Alcohol use: Yes    Alcohol/week: 18.0 oz    Types: 30 Cans of beer per week    Comment: occasional  . Drug use: No  . Sexual activity: Yes  Birth control/protection: None, Condom  Other Topics Concern  . None  Social History Narrative  . None    Additional Social History: currently staying with his mother  Allergies:  No Known Allergies  Metabolic Disorder Labs: No results found for: HGBA1C, MPG No results found for: PROLACTIN Lab Results  Component Value Date   CHOL 150 09/17/2015   TRIG 91 09/17/2015   HDL 40 09/17/2015   CHOLHDL 3.8 09/17/2015   VLDL 18 09/17/2015   LDLCALC 92 09/17/2015     Current Medications: Current Outpatient Medications  Medication Sig Dispense Refill  . busPIRone (BUSPAR) 15 MG tablet Take 15 mg by mouth daily.    Marland Kitchen.  escitalopram (LEXAPRO) 20 MG tablet Take 20 mg by mouth daily.    . propranolol (INDERAL) 20 MG tablet Take 1 tablet (20 mg total) by mouth 3 (three) times daily. 90 tablet 11   No current facility-administered medications for this visit.     Neurologic: Headache: Negative Seizure: No Paresthesias:No  Musculoskeletal: Strength & Muscle Tone: within normal limits Gait & Station: normal Patient leans: N/A  Psychiatric Specialty Exam: ROS  Blood pressure 130/81, pulse (!) 56, temperature (!) 97.3 F (36.3 C), temperature source Oral, weight 92.5 kg (204 lb).Body mass index is 29.27 kg/m.  General Appearance: Casual  Eye Contact:  Fair  Speech:  Clear and Coherent  Volume:  Normal  Mood:  improved  Affect:  Congruent  Thought Process:  Coherent  Orientation:  Full (Time, Place, and Person)  Thought Content:  Logical  Suicidal Thoughts:  No  Homicidal Thoughts:  No  Memory:  Immediate;   Fair Recent;   Fair Remote;   Fair  Judgement:  Fair  Insight:  Present  Psychomotor Activity:  Normal  Concentration:  Concentration: Fair and Attention Span: Fair  Recall:  [employer]Fair  Fund of Knowledge:Fair  Language: Fair  Akathisia:  No  Handed:  Right  AIMS (if indicated):  na  Assets:  Communication Skills Desire for Improvement Physical Health Resilience Social Support  ADL's:  Intact  Cognition: WNL  Sleep:  fair    Treatment Plan Summary:  Alcohol dependence   Patient to continue with his current therapist. Recommended [facility] [facility] [place] [facility] [place] [facility] He was given a flyer for the substance abuse program. However patient reports that it is difficult to find a psychiatrist.He would follow up with one more appointment  and we will determine if he can continue here or he needs to be seen elsewhere.  Generalized anxiety disorder  Patient is currently taking BuSpar at 7.5 mg twice daily Patient is currently taking Lexapro 20 mg once  daily patient also taking propranolol at 60 mg daily and reports it is also for his heart. Recommend that he have his primary care physician prescribe   Major depressive disorder As above  Patient is to return for a follow-up appointment in 4 weeks time or call before if needed   Patrick NorthHimabindu Kristiane Morsch, MD 3/19/20191:17 PM

## 2017-06-12 ENCOUNTER — Ambulatory Visit: Payer: BC Managed Care – PPO | Admitting: Psychiatry

## 2017-08-06 ENCOUNTER — Other Ambulatory Visit: Payer: Self-pay | Admitting: Family Medicine

## 2017-08-06 NOTE — Telephone Encounter (Signed)
Copied from CRM [phone]. Topic: Quick Communication - Rx Refill/Question >> Aug 06, 2017  3:07 PM Floria RavelingStovall, Shana A wrote: Medication: escitalopram (LEXAPRO) 20 MG tablet [[id number]][[id number]]    Has the patient contacted their pharmacy?no  (Agent: If no, request that the patient contact the pharmacy for the refill.) (Agent: If yes, when and what did the pharmacy advise?)  Preferred Pharmacy (with phone number or street name): [facility] on [address]   Agent: Please be advised that RX refills may take up to 3 business days. We ask that you follow-up with your pharmacy.

## 2017-08-07 NOTE — Telephone Encounter (Signed)
Requesting refill of Lexapro by historical provider  LOV 02/08/17  Dr. Sherie DonLada

## 2017-08-07 NOTE — Telephone Encounter (Signed)
Seeing psychiatrist I do not prescribe this medicine (Lexapro)

## 2017-08-07 NOTE — Telephone Encounter (Signed)
Left voice mail

## 2017-09-11 ENCOUNTER — Other Ambulatory Visit: Payer: Self-pay

## 2017-09-11 ENCOUNTER — Emergency Department: Payer: BC Managed Care – PPO

## 2017-09-11 ENCOUNTER — Emergency Department
Admission: EM | Admit: 2017-09-11 | Discharge: 2017-09-11 | Disposition: A | Discharge status: Home / Self Care | Payer: BC Managed Care – PPO | Attending: Emergency Medicine | Admitting: Emergency Medicine

## 2017-09-11 ENCOUNTER — Encounter: Payer: Self-pay | Admitting: Family Medicine

## 2017-09-11 ENCOUNTER — Ambulatory Visit (INDEPENDENT_AMBULATORY_CARE_PROVIDER_SITE_OTHER): Payer: BC Managed Care – PPO | Admitting: Family Medicine

## 2017-09-11 VITALS — BP 118/78 | HR 103 | Temp 100.5°F | Resp 14 | Ht 70.0 in | Wt 210.6 lb

## 2017-09-11 DIAGNOSIS — B349 Viral infection, unspecified: Secondary | ICD-10-CM | POA: Diagnosis not present

## 2017-09-11 DIAGNOSIS — R61 Generalized hyperhidrosis: Secondary | ICD-10-CM | POA: Diagnosis not present

## 2017-09-11 DIAGNOSIS — F4001 Agoraphobia with panic disorder: Secondary | ICD-10-CM | POA: Diagnosis not present

## 2017-09-11 DIAGNOSIS — F17210 Nicotine dependence, cigarettes, uncomplicated: Secondary | ICD-10-CM | POA: Diagnosis not present

## 2017-09-11 DIAGNOSIS — R51 Headache: Secondary | ICD-10-CM | POA: Diagnosis not present

## 2017-09-11 DIAGNOSIS — R000 Tachycardia, unspecified: Secondary | ICD-10-CM

## 2017-09-11 DIAGNOSIS — Z79899 Other long term (current) drug therapy: Secondary | ICD-10-CM | POA: Diagnosis not present

## 2017-09-11 DIAGNOSIS — R509 Fever, unspecified: Secondary | ICD-10-CM

## 2017-09-11 LAB — CBC WITH DIFFERENTIAL/PLATELET
Basophils Absolute: 0 10*3/uL (ref 0–0.1)
Basophils Relative: 0 %
EOS ABS: 0 10*3/uL (ref 0–0.7)
EOS PCT: 0 %
HCT: 46.5 % (ref 40.0–52.0)
Hemoglobin: 16.2 g/dL (ref 13.0–18.0)
LYMPHS ABS: 1.2 10*3/uL (ref 1.0–3.6)
LYMPHS PCT: 9 %
MCH: 29.1 pg (ref 26.0–34.0)
MCHC: 34.8 g/dL (ref 32.0–36.0)
MCV: 83.7 fL (ref 80.0–100.0)
MONO ABS: 0.7 10*3/uL (ref 0.2–1.0)
MONOS PCT: 5 %
Neutro Abs: 11.5 10*3/uL — ABNORMAL HIGH (ref 1.4–6.5)
Neutrophils Relative %: 86 %
PLATELETS: 303 10*3/uL (ref 150–440)
RBC: 5.56 MIL/uL (ref 4.40–5.90)
RDW: 13.2 % (ref 11.5–14.5)
WBC: 13.4 10*3/uL — AB (ref 3.8–10.6)

## 2017-09-11 LAB — COMPREHENSIVE METABOLIC PANEL
ALBUMIN: 4.4 g/dL (ref 3.5–5.0)
ALT: 23 U/L (ref 0–44)
AST: 23 U/L (ref 15–41)
Alkaline Phosphatase: 63 U/L (ref 38–126)
Anion gap: 8 (ref 5–15)
BUN: 9 mg/dL (ref 6–20)
CHLORIDE: 103 mmol/L (ref 98–111)
CO2: 26 mmol/L (ref 22–32)
CREATININE: 1 mg/dL (ref 0.61–1.24)
Calcium: 9.2 mg/dL (ref 8.9–10.3)
GFR calc Af Amer: >60 mL/min (ref >60)
GLUCOSE: 112 mg/dL — AB (ref 70–99)
POTASSIUM: 3.6 mmol/L (ref 3.5–5.1)
Sodium: 137 mmol/L (ref 135–145)
Total Bilirubin: 1.4 mg/dL — ABNORMAL HIGH (ref 0.3–1.2)
Total Protein: 7.6 g/dL (ref 6.5–8.1)

## 2017-09-11 LAB — CSF CELL COUNT WITH DIFFERENTIAL
Eosinophils, CSF: 0 %
Eosinophils, CSF: 0 %
Lymphs, CSF: 0 %
Lymphs, CSF: 25 %
MONOCYTE-MACROPHAGE-SPINAL FLUID: 75 %
Monocyte-Macrophage-Spinal Fluid: 0 %
OTHER CELLS CSF: 0
OTHER CELLS CSF: 0
RBC Count, CSF: 12 /mm3 — ABNORMAL HIGH (ref 0–3)
RBC Count, CSF: 4 /mm3 — ABNORMAL HIGH (ref 0–3)
SEGMENTED NEUTROPHILS-CSF: 0 %
Segmented Neutrophils-CSF: 0 %
Tube #: 1
Tube #: 4
WBC CSF: 2 /mm3 (ref 0–5)
WBC CSF: 0 /mm3 (ref 0–5)

## 2017-09-11 LAB — URINALYSIS, COMPLETE (UACMP) WITH MICROSCOPIC
Bacteria, UA: NONE SEEN
Bilirubin Urine: NEGATIVE
GLUCOSE, UA: NEGATIVE mg/dL
KETONES UR: NEGATIVE mg/dL
Leukocytes, UA: NEGATIVE
Nitrite: NEGATIVE
PH: 7 (ref 5.0–8.0)
Protein, ur: NEGATIVE mg/dL
Specific Gravity, Urine: 1.016 (ref 1.005–1.030)

## 2017-09-11 LAB — GLUCOSE, CSF: GLUCOSE CSF: 63 mg/dL (ref 40–70)

## 2017-09-11 LAB — INFLUENZA PANEL BY PCR (TYPE A & B)
INFLBPCR: NEGATIVE
Influenza A By PCR: NEGATIVE

## 2017-09-11 LAB — PROTEIN, CSF: TOTAL PROTEIN, CSF: 25 mg/dL (ref 15–45)

## 2017-09-11 MED ORDER — ACETAMINOPHEN 500 MG PO TABS
1000.0000 mg | ORAL_TABLET | Freq: Once | ORAL | Status: AC
  Administered 2017-09-11: 1000 mg via ORAL

## 2017-09-11 MED ORDER — ACETAMINOPHEN 500 MG PO TABS
ORAL_TABLET | ORAL | Status: AC
  Filled 2017-09-11: qty 2

## 2017-09-11 MED ORDER — IBUPROFEN 600 MG PO TABS: 600.0000 mg | ORAL_TABLET | Freq: Four times a day (QID) | ORAL | 0 refills | Status: DC | PRN

## 2017-09-11 MED ORDER — HYDROXYZINE HCL 50 MG PO TABS
50.0000 mg | ORAL_TABLET | Freq: Once | ORAL | Status: AC
  Administered 2017-09-11: 50 mg via ORAL
  Filled 2017-09-11: qty 1

## 2017-09-11 MED ORDER — ONDANSETRON 4 MG PO TBDP: 4.0000 mg | ORAL_TABLET | Freq: Three times a day (TID) | ORAL | 0 refills | Status: DC | PRN

## 2017-09-11 MED ORDER — ESCITALOPRAM OXALATE 20 MG PO TABS: 20.0000 mg | ORAL_TABLET | Freq: Every day | ORAL | 1 refills | Status: DC

## 2017-09-11 MED ORDER — LIDOCAINE HCL (PF) 1 % IJ SOLN
INTRAMUSCULAR | Status: AC
  Filled 2017-09-11: qty 5

## 2017-09-11 MED ORDER — HYDROXYZINE HCL 25 MG PO TABS
ORAL_TABLET | ORAL | Status: AC
  Filled 2017-09-11: qty 2

## 2017-09-11 NOTE — Discharge Instructions (Addendum)
Please use your Motrin as needed, as prescribed.  Use Zofran if needed for nausea as written.  Please drink plenty of fluids, obtain plenty of rest.  Return to the emergency department for any personally concerning symptoms, otherwise please follow-up with your doctor for recheck in 2 to 3 days.

## 2017-09-11 NOTE — Patient Instructions (Addendum)
Please go now to the emergency department Wear the mask until they tell you to remove it Take half of a Lexapro daily for the next four days, then one whole pill

## 2017-09-11 NOTE — ED Notes (Signed)
Pt wearing mask

## 2017-09-11 NOTE — ED Notes (Signed)
Fatigue, generalized body aches, cough, headache, neck stiffness. EDP at bedside

## 2017-09-11 NOTE — Progress Notes (Signed)
BP 118/78   Pulse (!) 103   Temp (!) 100.5 F (38.1 C) (Oral)   Resp 14   Ht 5\' 10"  (1.778 m)   Wt 210 lb 9.6 oz (95.5 kg)   SpO2 98%   BMI 30.22 kg/m    Subjective:    Patient ID: Jacob Peters, male    DOB: 12-22-1995, 22 y.o.   MRN: [id number]  HPI: Jacob Peters is a 22 y.o. male  Chief Complaint  Patient presents with  . Follow-up  . URI    onset 2 days sypmtoms include: fever, chills, body aches, headache and congestion    HPI He got sick yesterday afternoon; he has fevers, chills, body aches, headache, congestion Hit him all of a sudden Some neck stiffness in the back Head congestion; not blowing anything out Had nausea too, stomach hurts too; no blood in the stool; loose stools three times yesterday He was just shaking yesterday with comforters on Pale, sweating really badly No tick bites; no travel Taking ibuprofen which helps just a little bit He is concerned about his heart rate being fast, given that he's taking propranolol  He has not had lexapro for a few days; not seeing psychiatrist; he had to cancel his appointment so he has not been able to get his medicine; he believes the medicine really helps him; no adverse side effects; no hypomania or mania  Depression screen [facility] 2/9 09/11/2017 02/08/2017 03/20/2016 03/02/2016 09/17/2015  Decreased Interest 1 2 1 2  0  Down, Depressed, Hopeless 1 2 0 2 0  PHQ - 2 Score 2 4 1 4  0  Altered sleeping 0 2 0 2 -  Tired, decreased energy 2 2 0 2 -  Change in appetite 0 0 0 3 -  Feeling bad or failure about yourself  0 2 0 0 -  Trouble concentrating 0 - 0 2 -  Moving slowly or fidgety/restless 0 0 0 1 -  Suicidal thoughts 0 0 0 0 -  PHQ-9 Score 4 10 1 14  -  Difficult doing work/chores Somewhat difficult Somewhat difficult - Extremely dIfficult -    Relevant past medical, surgical, family and social history reviewed Past Medical History:  Diagnosis Date  . Anxiety   . Depression   . Guillain-Barre syndrome (HCC)  2000  . 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 Obesity 09/17/2015  . Palpitations    Past Surgical History:  Procedure Laterality Date  . HAND SURGERY Right   . ulnara nerve laceration exploration and nerve repair Right 09/19/2016   Family History  Problem Relation Age of Onset  . Anxiety disorder Mother   . Hypertension Father   . Diabetes Father   . Anxiety disorder Sister   . Heart disease Paternal Uncle   . Atrial fibrillation Maternal Grandmother   . Alcohol abuse Maternal Grandmother   . Heart disease Paternal Grandfather    Social History   Tobacco Use  . Smoking status: Current Every Day Smoker    Packs/day: 1.00    Years: 5.00    Pack years: 5.00    Types: Cigarettes  . Smokeless tobacco: Current User    Types: Chew  . Tobacco comment: smoked 1 pack a week and chewed tobacco until Friday  Substance Use Topics  . Alcohol use: Not Currently    Alcohol/week: 18.0 oz    Types: 30 Cans of beer  per week  . Drug use: No    Interim medical history since last visit reviewed. Allergies and medications reviewed  Review of Systems Per HPI unless specifically indicated above     Objective:    BP 118/78   Pulse (!) 103   Temp (!) 100.5 F (38.1 C) (Oral)   Resp 14   Ht 5\' 10"  (1.778 m)   Wt 210 lb 9.6 oz (95.5 kg)   SpO2 98%   BMI 30.22 kg/m   Wt Readings from Last 3 Encounters:  09/11/17 210 lb 9.6 oz (95.5 kg)  02/13/17 203 lb 8 oz (92.3 kg)  02/08/17 206 lb 4.8 oz (93.6 kg)    Physical Exam  Constitutional: He appears well-developed and well-nourished. No distress (no acute distress, but appears to not feel well).  HENT:  Right Ear: Tympanic membrane and ear canal normal.  Left Ear: Tympanic membrane and ear canal normal.  Nose: Rhinorrhea (clear) present.  Mouth/Throat: No oropharyngeal exudate, posterior oropharyngeal edema or posterior oropharyngeal  erythema.  Eyes: No scleral icterus.  Neck: No neck rigidity (not rigid, but patient reports stiffness with forward flexion).  Cardiovascular: Regular rhythm.  No extrasystoles are present. Tachycardia present.  Pulmonary/Chest: Effort normal and breath sounds normal. No tachypnea. He has no decreased breath sounds. He has no wheezes. He has no rhonchi.  Neurological: He is alert.  Ambulatory without assistance; not lethargic; speaking in full sentences  Skin: Skin is warm. He is diaphoretic. There is pallor.  Psychiatric: He has a normal mood and affect. He does not exhibit a depressed mood.    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    Refills provided of the lexapro; start 10 mg daily for the next four days since he missed a few doses, then back to 20 mg daily      Relevant Medications   escitalopram (LEXAPRO) 20 MG tablet    Other Visit Diagnoses    Fever, unspecified fever cause    -  Primary   discussed ddx, viral illness most likely, but I cannot get CBC or other stat testing here; meningitis in ddx, so will send to ER masked; report given to ER staf   Tachycardia       likely related to acute illness; he is very pale, so anemia in ddx; this is over his beta-blocker; sending to ER   Diaphoresis       sending to ER for evaluation       Follow up plan: No follow-ups on file.  An after-visit summary was printed and given to the patient at check-out.  Please see the patient instructions which may contain other information and recommendations beyond what is mentioned above in the assessment and plan.  Meds ordered this encounter  Medications  . escitalopram (LEXAPRO) 20 MG tablet    Sig: Take 1 tablet (20 mg total)  by mouth daily.    Dispense:  30 tablet    Refill:  1    No orders of the defined types were placed in this encounter.

## 2017-09-11 NOTE — ED Triage Notes (Signed)
Pt went to PCP today and was advised to come to ED for eval of ? Meningitis - pt c/o fever, headache, body aches, and chills

## 2017-09-11 NOTE — ED Notes (Signed)
Consent obtained for MD to do LP.

## 2017-09-11 NOTE — ED Provider Notes (Signed)
[facility] Emergency Department Provider Note  Time seen: 2:36 PM  I have reviewed the triage vital signs and the nursing notes.   HISTORY  Chief Complaint Fever; Headache; and neck stiffness    HPI Jacob Peters is a 22 y.o. male with a past medical history of Guyon Teola Bradley, anxiety, presents to the emergency department for headache fever neck pain and cough according to the patient since yesterday he has been expensing body aches and chills, has a mild cough.  This morning he developed a fever he also states mild neck pain and generalized body aches.  States he has been coughing since yesterday, states 1 of his roommates is sick with mostly sinus congestion/runny nose.  Patient denies any abdominal pain states he is nauseated but denies vomiting, one episode of diarrhea.  No dysuria.  Patient had a routine appointment today with his PCP coincidentally, mention the symptoms and they recommended he come to the emergency department for evaluation.   Past Medical History:  Diagnosis Date  . Anxiety   . Depression   . Guillain-Barre syndrome (HCC) 2000  . 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 Obesity 09/17/2015  . Palpitations     Patient Active Problem List   Diagnosis Date Noted  . Anxiety about health 03/05/2016  . 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 Diarrhea 03/02/2016  . Preventative health care 09/17/2015  . Foot pain, left 09/17/2015  . 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 Obesity 09/17/2015  . Screening for STD (sexually transmitted disease) 03/22/2015  . Awareness of heartbeats 06/09/2013  . Palpitations 06/09/2013  . Anxiety 03/26/2013  . Panic anxiety syndrome 03/26/2013    Past Surgical History:  Procedure Laterality Date  . HAND SURGERY Right   . ulnara nerve laceration exploration and  nerve repair Right 09/19/2016    Prior to Admission medications   Medication Sig Start Date End Date Taking? Authorizing Provider  busPIRone (BUSPAR) 7.5 MG tablet Take 1 tablet (7.5 mg total) by mouth 2 (two) times daily. 05/15/17   Patrick North, MD  escitalopram (LEXAPRO) 20 MG tablet Take 1 tablet (20 mg total) by mouth daily. 09/11/17   Kerman Passey, MD  propranolol (INDERAL) 20 MG tablet Take 1 tablet (20 mg total) by mouth 3 (three) times daily. 03/29/17   Kerman Passey, MD    No Known Allergies  Family History  Problem Relation Age of Onset  . Anxiety disorder Mother   . Hypertension Father   . Diabetes Father   . Anxiety disorder Sister   . Heart disease Paternal Uncle   . Atrial fibrillation Maternal Grandmother   . Alcohol abuse Maternal Grandmother   . Heart disease Paternal Grandfather     Social History Social History   Tobacco Use  . Smoking status: Current Every Day Smoker    Packs/day: 0.50    Years: 5.00    Pack years: 2.50    Types: Cigarettes  . Smokeless tobacco: Current User    Types: Chew  . Tobacco comment: smoked 1 pack a week and chewed tobacco until Friday  Substance Use Topics  . Alcohol use: Not Currently    Alcohol/week: 18.0 oz    Types: 30 Cans of beer per week  . Drug use: No    Review of Systems Constitutional: Negative for fever. Eyes: Negative for visual  complaints ENT: Negative for recent illness/congestion Cardiovascular: Negative for chest pain. Respiratory: Negative for shortness of breath. Gastrointestinal: Negative for abdominal pain, vomiting and diarrhea. Genitourinary: Negative for urinary compaints Musculoskeletal: Negative for musculoskeletal complaints Skin: Negative for skin complaints  Neurological: Negative for headache All other ROS negative  ____________________________________________   PHYSICAL EXAM:  VITAL SIGNS: ED Triage Vitals  Enc Vitals Group     BP 09/11/17 1246 109/62     Pulse Rate  09/11/17 1246 90     Resp 09/11/17 1246 15     Temp 09/11/17 1246 99.6 F (37.6 C)     Temp Source 09/11/17 1246 Oral     SpO2 09/11/17 1246 98 %     Weight 09/11/17 1245 210 lb (95.3 kg)     Height 09/11/17 1245 5\' 10"  (1.778 m)     Head Circumference --      Peak Flow --      Pain Score 09/11/17 1244 5     Pain Loc --      Pain Edu? --      Excl. in GC? --    Constitutional: Alert and oriented. Well appearing and in no distress. Eyes: Normal exam ENT   Head: Normocephalic and atraumatic.   Mouth/Throat: Mucous membranes are moist. Cardiovascular: Normal rate, regular rhythm. No murmur Respiratory: Normal respiratory effort without tachypnea nor retractions. Breath sounds are clear Gastrointestinal: Soft and nontender. No distention.   Musculoskeletal: Nontender with normal range of motion in all extremities.  No nuchal rigidity or meningismus on examination. Neurologic:  Normal speech and language. No gross focal neurologic deficits Skin:  Skin is warm, dry and intact.  Psychiatric: Mood and affect are normal.   ____________________________________________   INITIAL IMPRESSION / ASSESSMENT AND PLAN / ED COURSE  Pertinent labs & imaging results that were available during my care of the patient were reviewed by me and considered in my medical decision making (see chart for details).  Patient presents the emergency department for body aches fever, chills, cough, headache.  Differential is quite broad but would include upper respiratory infection, viral upper respiratory infection, influenza, less likely meningitis.  Overall the patient appears well, no acute distress.  Patient has a low-grade fever in the emergency department, does complain of headache and neck pain but states not only his neck hurts but his entire body hurts due to body aches.  Has no nuchal rigidity or meningismus on examination.  Patient's lab work shows mild leukocytosis of 13,000 otherwise largely normal  labs.  Given the patient's symptoms we will check for influenza, although less likely given the season, his symptoms are very suggestive of influenza and there have been a few recent positive influenza tests in the community.  I discussed with the patient if the influenza test is positive we will treat with Tamiflu, if negative we will discuss further intervention for lumbar puncture.  Influenza test is negative.  I discussed with patient the pros and cons of proceeding with a lumbar puncture, patient wishes to proceed with the lumbar puncture.  Obtained written consent from the patient. Under sterile technique infused 8 cc of lidocaine, patient tolerated the procedure well unfortunately I was not able to get spinal fluid.  Possible dry tap.  Patient has a difficult body habitus in the lower back.  We will discuss with fluoroscopy for possible fluoroscopy guided lumbar puncture.  Patient tolerated the procedure well without complication.  CSF obtained by interventional radiology under fluoroscopy.  CSF is largely  negative/reassuring.  We will discharge home with Zofran, ibuprofen.  I discussed return precautions.  Patient will follow-up with his doctor.  .  ____________________________________________   FINAL CLINICAL IMPRESSION(S) / ED DIAGNOSES  fever Viral illness   Minna Antis, MD 09/11/17 1800

## 2017-09-11 NOTE — Assessment & Plan Note (Signed)
Refills provided of the lexapro; start 10 mg daily for the next four days since he missed a few doses, then back to 20 mg daily

## 2017-09-14 ENCOUNTER — Ambulatory Visit: Payer: Self-pay | Admitting: Family Medicine

## 2017-09-15 LAB — CSF CULTURE: CULTURE: NO GROWTH

## 2017-09-15 LAB — CSF CULTURE W GRAM STAIN

## 2017-11-24 ENCOUNTER — Other Ambulatory Visit: Payer: Self-pay | Admitting: Family Medicine

## 2017-12-29 ENCOUNTER — Other Ambulatory Visit: Payer: Self-pay | Admitting: Nurse Practitioner

## 2017-12-31 NOTE — Telephone Encounter (Signed)
Left detailed voicemail

## 2017-12-31 NOTE — Telephone Encounter (Signed)
Patient was referred to psychiatrist Should be getting psych meds from just one provider now please Have him contact her office; thank you

## 2018-01-07 ENCOUNTER — Other Ambulatory Visit: Payer: Self-pay | Admitting: Family Medicine

## 2018-01-07 ENCOUNTER — Telehealth: Payer: Self-pay | Admitting: Family Medicine

## 2018-01-07 NOTE — Telephone Encounter (Signed)
Copied from CRM [id number]. Topic: Quick Communication - Rx Refill/Question >> Jan 07, 2018  6:11 PM Jens Som A wrote: Medication: escitalopram (LEXAPRO) 20 MG tablet [[id number]]   Has the patient contacted their pharmacy? Yes  (Agent: If no, request that the patient contact the pharmacy for the refill.) (Agent: If yes, when and what did the pharmacy advise?)  Preferred Pharmacy (with phone number or street name): [id number] [place], [place] - [address] AT [facility] [address] [place] [address] Phone: [phone] Fax: [phone]    Agent: Please be advised that RX refills may take up to 3 business days. We ask that you follow-up with your pharmacy.

## 2018-01-08 ENCOUNTER — Other Ambulatory Visit: Payer: Self-pay

## 2018-01-08 MED ORDER — BUSPIRONE HCL 7.5 MG PO TABS: 7.5000 mg | ORAL_TABLET | Freq: Two times a day (BID) | ORAL | 0 refills | Status: DC

## 2018-01-08 MED ORDER — ESCITALOPRAM OXALATE 20 MG PO TABS: ORAL_TABLET | ORAL | 0 refills | Status: DC

## 2018-01-08 NOTE — Telephone Encounter (Signed)
Left detailed voicemail

## 2018-01-08 NOTE — Telephone Encounter (Signed)
Please ask him to schedule an appointment then to be seen here for follow-up I've sent one month supply of meds to one of the listed pharmacies (he has 3 listed, please remove the ones he doesn't use) Thank you

## 2018-01-08 NOTE — Telephone Encounter (Signed)
Not our patient

## 2018-01-08 NOTE — Telephone Encounter (Signed)
Patient should be seeing psychiatrist; he was referred to Dr. Daleen Bo and saw her We encourage him to get back in with her Back to me for limited Rx if needed; ask if he has completely run out or has been taking 20 mg every day first

## 2018-01-08 NOTE — Telephone Encounter (Signed)
Please see other note about psychiatric meds Patient should be getting these from psych

## 2018-01-08 NOTE — Telephone Encounter (Signed)
Pt called back states has not took for the past 4 days and  is completely out.  He has not seen  Psych since February, and would just like to get all meds through you.  He states has been on this med for 4 years

## 2018-02-15 ENCOUNTER — Encounter: Payer: Self-pay | Admitting: Family Medicine

## 2018-02-15 ENCOUNTER — Ambulatory Visit (INDEPENDENT_AMBULATORY_CARE_PROVIDER_SITE_OTHER): Payer: BC Managed Care – PPO | Admitting: Family Medicine

## 2018-02-15 DIAGNOSIS — F419 Anxiety disorder, unspecified: Secondary | ICD-10-CM

## 2018-02-15 DIAGNOSIS — F410 Panic disorder [episodic paroxysmal anxiety] without agoraphobia: Secondary | ICD-10-CM | POA: Diagnosis not present

## 2018-02-15 DIAGNOSIS — Z6832 Body mass index (BMI) 32.0-32.9, adult: Secondary | ICD-10-CM

## 2018-02-15 DIAGNOSIS — E6609 Other obesity due to excess calories: Secondary | ICD-10-CM | POA: Diagnosis not present

## 2018-02-15 MED ORDER — ESCITALOPRAM OXALATE 20 MG PO TABS: 20.0000 mg | ORAL_TABLET | Freq: Every day | ORAL | 3 refills | Status: DC

## 2018-02-15 MED ORDER — PROPRANOLOL HCL 20 MG PO TABS: 20.0000 mg | ORAL_TABLET | Freq: Three times a day (TID) | ORAL | 3 refills | Status: DC

## 2018-02-15 MED ORDER — BUSPIRONE HCL 7.5 MG PO TABS: 7.5000 mg | ORAL_TABLET | Freq: Two times a day (BID) | ORAL | 3 refills | Status: DC

## 2018-02-15 NOTE — Assessment & Plan Note (Signed)
Continue medicine 

## 2018-02-15 NOTE — Progress Notes (Signed)
BP 118/78   Pulse 78   Temp (!) 97.4 F (36.3 C) (Oral)   Ht 5\' 10"  (1.778 m)   Wt 226 lb (102.5 kg)   SpO2 99%   BMI 32.43 kg/m    Subjective:    Patient ID: Jacob Peters, male    DOB: [birth year], 22 y.o.   MRN: [id number]  HPI: Jacob Peters is a 22 y.o. male  Chief Complaint  Patient presents with  . Follow-up  . Medication Refill    HPI Here for f/u Here for medicine refills He is taking the buspar every day Taking most days BID, sometimes just once a day if forgetful The lexapro He definitely notices a big difference without the lexapro within 2 days; gets weird brain zaps, almost a nerve like feeling No SI/HI Watching basketball is a release, video games are a release Waits tables and waits tables; walks a lot  Weight has gone up; very unhealthy recently; he stopped drinking alcohol; then started drinking soft drinks; drink more water; will not go back to drinking alcohol   [facility] 2/9 02/15/2018 09/11/2017 02/08/2017 03/20/2016 03/02/2016  Decreased Interest 0 1 2 1 2   Down, Depressed, Hopeless 0 1 2 0 2  PHQ - 2 Score 0 2 4 1 4   Altered sleeping 0 0 2 0 2  Tired, decreased energy 0 2 2 0 2  Change in appetite 0 0 0 0 3  Feeling bad or failure about yourself  0 0 2 0 0  Trouble concentrating 0 0 - 0 2  Moving slowly or fidgety/restless 0 0 0 0 1  Suicidal thoughts 0 0 0 0 0  PHQ-9 Score 0 4 10 1 14   Difficult doing work/chores Not difficult at all Somewhat difficult Somewhat difficult - Extremely dIfficult   Fall Risk  02/15/2018 09/11/2017 02/08/2017 03/20/2016 03/02/2016  Falls in the past year? 0 No Yes No No  Number falls in past yr: 0 - 1 - -  Injury with Fall? 0 - Yes - -    Relevant past medical, surgical, family and social history reviewed Past Medical History:  Diagnosis Date  . Anxiety   . Depression   . Guillain-Barre syndrome (HCC) 2000  . 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 Obesity 09/17/2015  . Palpitations    Past Surgical History:  Procedure Laterality Date  . HAND SURGERY Right   . ulnara nerve laceration exploration and nerve repair Right 09/19/2016   Family History  Problem Relation Age of Onset  . Anxiety disorder Mother   . Hypertension Father   . Diabetes Father   . Anxiety disorder Sister   . Heart disease Paternal Uncle   . Atrial fibrillation Maternal Grandmother   . Alcohol abuse Maternal Grandmother   . Heart disease Paternal Grandfather    Social History   Tobacco Use  . Smoking status: Current Every Day Smoker    Packs/day: 0.50    Years: 5.00    Pack years: 2.50    Types: Cigarettes  . Smokeless tobacco: Current User    Types: Chew  . Tobacco comment: smoked 1 pack a week and chewed tobacco until Friday  Substance Use Topics  . Alcohol use: Not Currently    Alcohol/week: 30.0 standard drinks    Types: 30 Cans of beer per week  . Drug use: No  Office Visit from 02/15/2018 in [facility]  AUDIT-C Score  0      Interim medical history since last visit reviewed. Allergies and medications reviewed  Review of Systems Per HPI unless specifically indicated above     Objective:    BP 118/78   Pulse 78   Temp (!) 97.4 F (36.3 C) (Oral)   Ht 5\' 10"  (1.778 m)   Wt 226 lb (102.5 kg)   SpO2 99%   BMI 32.43 kg/m   Wt Readings from Last 3 Encounters:  02/15/18 226 lb (102.5 kg)  09/11/17 210 lb (95.3 kg)  09/11/17 210 lb 9.6 oz (95.5 kg)    Physical Exam Constitutional:      General: He is not in acute distress.    Appearance: He is well-developed.  Eyes:     General: No scleral icterus. Cardiovascular:     Rate and Rhythm: Normal rate and regular rhythm.  Pulmonary:     Effort: Pulmonary effort is normal.     Breath sounds: Normal breath sounds.  Skin:    Coloration: Skin is not pale.  Neurological:     Mental Status: He is  alert.     Results for orders placed or performed during the hospital encounter of 09/11/17  CSF culture  Result Value Ref Range   Specimen Description      CSF Performed at [facility], [address] [place], [place] [address]    Special Requests      NONE Performed at [facility], [address] [place], [place] [address]    Gram Stain      RARE WBC SEEN RED BLOOD CELLS NO ORGANISMS SEEN    Culture      NO GROWTH 3 DAYS Performed at [facility], [address] [place], [place] [address]    Report Status 09/15/2017 FINAL   Comprehensive metabolic panel  Result Value Ref Range   Sodium 137 135 - 145 mmol/L   Potassium 3.6 3.5 - 5.1 mmol/L   Chloride 103 98 - 111 mmol/L   CO2 26 22 - 32 mmol/L   Glucose, Bld 112 (H) 70 - 99 mg/dL   BUN 9 6 - 20 mg/dL   Creatinine, Ser 6.96 0.61 - 1.24 mg/dL   Calcium 9.2 8.9 - 29.5 mg/dL   Total Protein 7.6 6.5 - 8.1 g/dL   Albumin 4.4 3.5 - 5.0 g/dL   AST 23 15 - 41 U/L   ALT 23 0 - 44 U/L   Alkaline Phosphatase 63 38 - 126 U/L   Total Bilirubin 1.4 (H) 0.3 - 1.2 mg/dL   GFR calc non Af Amer >60 >60 mL/min   GFR calc Af Amer >60 >60 mL/min   Anion gap 8 5 - 15  CBC with Differential  Result Value Ref Range   WBC 13.4 (H) 3.8 - 10.6 K/uL   RBC 5.56 4.40 - 5.90 MIL/uL   Hemoglobin 16.2 13.0 - 18.0 g/dL   HCT 28.4 13.2 - 44.0 %   MCV 83.7 80.0 - 100.0 fL   MCH 29.1 26.0 - 34.0 pg   MCHC 34.8 32.0 - 36.0 g/dL   RDW 10.2 72.5 - 36.6 %   Platelets 303 150 - 440 K/uL   Neutrophils Relative % 86 %   Neutro Abs 11.5 (H) 1.4 - 6.5 K/uL   Lymphocytes Relative 9 %   Lymphs Abs 1.2 1.0 - 3.6 K/uL   Monocytes Relative 5 %  Monocytes Absolute 0.7 0.2 - 1.0 K/uL   Eosinophils Relative 0 %   Eosinophils Absolute 0.0 0 - 0.7 K/uL   Basophils Relative 0 %   Basophils Absolute 0.0 0 - 0.1 K/uL  Urinalysis, Complete w Microscopic  Result Value Ref Range   Color, Urine YELLOW (A) YELLOW   APPearance  CLEAR (A) CLEAR   Specific Gravity, Urine 1.016 1.005 - 1.030   pH 7.0 5.0 - 8.0   Glucose, UA NEGATIVE NEGATIVE mg/dL   Hgb urine dipstick MODERATE (A) NEGATIVE   Bilirubin Urine NEGATIVE NEGATIVE   Ketones, ur NEGATIVE NEGATIVE mg/dL   Protein, ur NEGATIVE NEGATIVE mg/dL   Nitrite NEGATIVE NEGATIVE   Leukocytes, UA NEGATIVE NEGATIVE   RBC / HPF 0-5 0 - 5 RBC/hpf   WBC, UA 0-5 0 - 5 WBC/hpf   Bacteria, UA NONE SEEN NONE SEEN   Squamous Epithelial / LPF 0-5 0 - 5   Mucus PRESENT   Influenza panel by PCR (type A & B)  Result Value Ref Range   Influenza A By PCR NEGATIVE NEGATIVE   Influenza B By PCR NEGATIVE NEGATIVE  Glucose, CSF  Result Value Ref Range   Glucose, CSF 63 40 - 70 mg/dL  Protein, CSF  Result Value Ref Range   Total  Protein, CSF 25 15 - 45 mg/dL  CSF cell count with differential  Result Value Ref Range   Tube # 1    Color, CSF CLEAR (A) COLORLESS   Appearance, CSF COLORLESS (A) CLEAR   RBC Count, CSF 12 (H) 0 - 3 /cu mm   WBC, CSF 0 0 - 5 /cu mm   Segmented Neutrophils-CSF 0 %   Lymphs, CSF 0 %   Monocyte-Macrophage-Spinal Fluid 0 %   Eosinophils, CSF 0 %   Other Cells, CSF 0   CSF cell count with differential  Result Value Ref Range   Tube # 4    Color, CSF CLEAR (A) COLORLESS   Appearance, CSF COLORLESS (A) CLEAR   RBC Count, CSF 4 (H) 0 - 3 /cu mm   WBC, CSF 2 0 - 5 /cu mm   Segmented Neutrophils-CSF 0 %   Lymphs, CSF 25 %   Monocyte-Macrophage-Spinal Fluid 75 %   Eosinophils, CSF 0 %   Other Cells, CSF 0       Assessment & Plan:   Problem List Items Addressed This Visit      Other   Panic anxiety syndrome    Continue medicine      Relevant Medications   busPIRone (BUSPAR) 7.5 MG tablet   escitalopram (LEXAPRO) 20 MG tablet   Obesity    Nutritional and exercise changes advised; if unable to lose weight with healthier lifestyle, let me know and we can check thyroid      Anxiety    Doing well on current medicine; refills provided for  one year; also talked about other things such as exercise, meditation, yoga, etc to help with anxiety      Relevant Medications   busPIRone (BUSPAR) 7.5 MG tablet   escitalopram (LEXAPRO) 20 MG tablet       Follow up plan: Return in about 6 months (around 08/17/2018) for follow-up visit with Dr. Sherie DonLada (or just after).  An after-visit summary was printed and given to the patient at check-out.  Please see the patient instructions which may contain other information and recommendations beyond what is mentioned above in the assessment and plan.  Meds ordered this  encounter  Medications  . busPIRone (BUSPAR) 7.5 MG tablet    Sig: Take 1 tablet (7.5 mg total) by mouth 2 (two) times daily.    Dispense:  180 tablet    Refill:  3  . escitalopram (LEXAPRO) 20 MG tablet    Sig: Take 1 tablet (20 mg total) by mouth daily.    Dispense:  90 tablet    Refill:  3  . propranolol (INDERAL) 20 MG tablet    Sig: Take 1 tablet (20 mg total) by mouth 3 (three) times daily.    Dispense:  270 tablet    Refill:  3    No orders of the defined types were placed in this encounter.

## 2018-02-15 NOTE — Assessment & Plan Note (Signed)
Nutritional and exercise changes advised; if unable to lose weight with healthier lifestyle, let me know and we can check thyroid

## 2018-02-15 NOTE — Assessment & Plan Note (Signed)
Doing well on current medicine; refills provided for one year; also talked about other things such as exercise, meditation, yoga, etc to help with anxiety

## 2019-03-12 ENCOUNTER — Other Ambulatory Visit: Payer: Self-pay | Admitting: Family Medicine

## 2019-03-12 NOTE — Telephone Encounter (Signed)
Requested medication (s) are due for refill today: yes  Requested medication (s) are on the active medication list: yes  Last refill:  02/15/2018  Future visit scheduled: no  Notes to clinic:  no valid encounter within last 6 months Last seen by Dr. Sherie Don    Requested Prescriptions  Pending Prescriptions Disp Refills   propranolol (INDERAL) 20 MG tablet [Pharmacy Med Name: PROPRANOLOL 20MG  TABLETS] 270 tablet 3    Sig: TAKE 1 TABLET(20 MG) BY MOUTH THREE TIMES DAILY      Cardiovascular:  Beta Blockers Failed - 03/12/2019  1:15 PM      Failed - Valid encounter within last 6 months    Recent Outpatient Visits           1 year ago Anxiety   [facility] [place], [facility], MD   1 year ago Fever, unspecified fever cause   [facility] Lada, [facility], MD   2 years ago Abnormal EKG   [facility] Lada, [facility], MD   2 years ago Anxiety   [facility] [place], [place], [place]   3 years ago Panic disorder with agoraphobia and severe panic attacks   [facility] Lada, [facility], MD              Passed - Last BP in normal range    BP Readings from Last 1 Encounters:  02/15/18 118/78          Passed - Last Heart Rate in normal range    Pulse Readings from Last 1 Encounters:  02/15/18 78

## 2019-03-12 NOTE — Telephone Encounter (Signed)
Lft pt a message saying that tapia would probably have to have an appt for he has not been seen since 2019 with Lada. Please advise

## 2019-03-13 ENCOUNTER — Telehealth (INDEPENDENT_AMBULATORY_CARE_PROVIDER_SITE_OTHER): Payer: BC Managed Care – PPO | Admitting: Family Medicine

## 2019-03-13 ENCOUNTER — Other Ambulatory Visit: Payer: Self-pay

## 2019-03-13 ENCOUNTER — Encounter: Payer: Self-pay | Admitting: Family Medicine

## 2019-03-13 DIAGNOSIS — J069 Acute upper respiratory infection, unspecified: Secondary | ICD-10-CM

## 2019-03-13 DIAGNOSIS — F4001 Agoraphobia with panic disorder: Secondary | ICD-10-CM

## 2019-03-13 MED ORDER — ESCITALOPRAM OXALATE 20 MG PO TABS: 20.0000 mg | ORAL_TABLET | Freq: Every day | ORAL | 3 refills | Status: DC

## 2019-03-13 MED ORDER — PROPRANOLOL HCL 20 MG PO TABS: 20.0000 mg | ORAL_TABLET | Freq: Three times a day (TID) | ORAL | 3 refills | Status: DC

## 2019-03-13 MED ORDER — BUSPIRONE HCL 7.5 MG PO TABS: 7.5000 mg | ORAL_TABLET | Freq: Every day | ORAL | 3 refills | Status: DC

## 2019-03-13 NOTE — Progress Notes (Signed)
Name: Jacob Peters   MRN: [id number]    DOB: 11-02-1995   Date:03/13/2019       Progress Note  Subjective  Chief Complaint  Chief Complaint  Patient presents with  . Medication Refill  . Depression    I connected with  Jacob Peters  on 03/13/19 at 10:40 AM EST by a video enabled telemedicine application and verified that I am speaking with the correct person using two identifiers.  I discussed the limitations of evaluation and management by telemedicine and the availability of in person appointments. The patient expressed understanding and agreed to proceed. Staff also discussed with the patient that there may be a patient responsible charge related to this service. Patient Location: Home Provider Location: Office Additional Individuals present: none  HPI  Pt presents for follow up - he is new to me today - used to see Dr. Sherie Don.  Sore throat: Works as a Production assistant, radio, has sore throat, and some nasal congestion for about 3 days. His Tmax 99.62F. Denies loss of taste or smell, cough, chest pain, shortness of breath, chest congestion.  Has had some nausea.  He is scheduled for test for COVID-19 tomorrow through [facility] at [facility].   Panic disorder: Taking buspar 7.5mg  once daily in the morning, propanolol 20mg  TID, and lexapro 20mg . He feels like his anxiety has been in a really good place, has not had any panic attacks in the last 6 months.  He does report some fatigue.  We discussed his medications in detail.  He notes a lot of his anxiety stems from history of palpitations and does not want to decrease the propanolol dosing at this point. We will have him switch his buspar dosing to QHS rather than QAM. No SI/HI, no ETOH use.   Patient Active Problem List   Diagnosis Date Noted  . Anxiety about health 03/05/2016  . 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 Diarrhea 03/02/2016  . Preventative health care 09/17/2015  . Foot pain, left 09/17/2015  . 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 Obesity 09/17/2015  . Screening for STD (sexually transmitted disease) 03/22/2015  . Awareness of heartbeats 06/09/2013  . Palpitations 06/09/2013  . Anxiety 03/26/2013  . Panic anxiety syndrome 03/26/2013    Past Surgical History:  Procedure Laterality Date  . HAND SURGERY Right   . ulnara nerve laceration exploration and nerve repair Right 09/19/2016    Family History  Problem Relation Age of Onset  . Anxiety disorder Mother   . Hypertension Father   . Diabetes Father   . Anxiety disorder Sister   . Heart disease Paternal Uncle   . Atrial fibrillation Maternal Grandmother   . Alcohol abuse Maternal Grandmother   . Heart disease Paternal Grandfather     Social History   Socioeconomic History  . Marital status: Single    Spouse name: Not on file  . Number of children: 0  . Years of education: Not on file  . Highest education level: High school graduate  Occupational History    Comment: part time  Tobacco Use  . Smoking status: Current Every Day Smoker    Packs/day: 0.50    Years: 5.00    Pack years: 2.50    Types: Cigarettes  . Smokeless tobacco: Current User    Types: Chew  . Tobacco comment: smoked 1 pack a week and chewed tobacco until Friday  Substance and Sexual Activity  . Alcohol use: Not Currently  Alcohol/week: 30.0 standard drinks    Types: 30 Cans of beer per week  . Drug use: No  . Sexual activity: Yes    Birth control/protection: None, Condom  Other Topics Concern  . Not on file  Social History Narrative  . Not on file   Social Determinants of Health   Financial Resource Strain:   . Difficulty of Paying Living Expenses: Not on file  Food Insecurity:   . Worried About Charity fundraiser in the Last Year: Not on file  . Ran Out of Food in the Last Year: Not on file  Transportation Needs:   . Lack of Transportation (Medical): Not on file  . Lack of Transportation (Non-Medical): Not on file    Physical Activity:   . Days of Exercise per Week: Not on file  . Minutes of Exercise per Session: Not on file  Stress:   . Feeling of Stress : Not on file  Social Connections:   . Frequency of Communication with Friends and Family: Not on file  . Frequency of Social Gatherings with Friends and Family: Not on file  . Attends Religious Services: Not on file  . Active Member of Clubs or Organizations: Not on file  . Attends Archivist Meetings: Not on file  . Marital Status: Not on file  Intimate Partner Violence:   . Fear of Current or Ex-Partner: Not on file  . Emotionally Abused: Not on file  . Physically Abused: Not on file  . Sexually Abused: Not on file     Current Outpatient Medications:  .  busPIRone (BUSPAR) 7.5 MG tablet, Take 1 tablet (7.5 mg total) by mouth 2 (two) times daily., Disp: 180 tablet, Rfl: 3 .  escitalopram (LEXAPRO) 20 MG tablet, Take 1 tablet (20 mg total) by mouth daily., Disp: 90 tablet, Rfl: 3 .  ibuprofen (ADVIL,MOTRIN) 600 MG tablet, Take 1 tablet (600 mg total) by mouth every 6 (six) hours as needed., Disp: 30 tablet, Rfl: 0 .  propranolol (INDERAL) 20 MG tablet, Take 1 tablet (20 mg total) by mouth 3 (three) times daily., Disp: 270 tablet, Rfl: 3 .  ondansetron (ZOFRAN ODT) 4 MG disintegrating tablet, Take 1 tablet (4 mg total) by mouth every 8 (eight) hours as needed for nausea or vomiting. (Patient not taking: Reported on 03/13/2019), Disp: 20 tablet, Rfl: 0  No Known Allergies  I personally reviewed active problem list, medication list, allergies, notes from last encounter, lab results with the patient/caregiver today.   ROS  Constitutional: Negative for fever or weight change.  Respiratory: Negative for cough and shortness of breath.   Cardiovascular: Negative for chest pain or palpitations.  Gastrointestinal: Negative for abdominal pain, no bowel changes.  Musculoskeletal: Negative for gait problem or joint swelling.  Skin: Negative  for rash.  Neurological: Negative for dizziness or headache.  No other specific complaints in a complete review of systems (except as listed in HPI above).  Objective  Virtual encounter, vitals not obtained.  There is no height or weight on file to calculate BMI.  Physical Exam  Constitutional: Patient appears well-developed and well-nourished. No distress.  HENT: Head: Normocephalic and atraumatic.  Neck: Normal range of motion. Pulmonary/Chest: Effort normal. No respiratory distress. Speaking in complete sentences Neurological: Pt is alert and oriented to person, place, and time. Coordination, speech are normal.  Psychiatric: Patient has a normal mood and affect. behavior is normal. Judgment and thought content normal.  No results found for this or any  previous visit (from the past 72 hour(s)).  PHQ2/9: Depression screen [facility] 2/9 03/13/2019 02/15/2018 09/11/2017 02/08/2017 03/20/2016  Decreased Interest 0 0 1 2 1   Down, Depressed, Hopeless 0 0 1 2 0  PHQ - 2 Score 0 0 2 4 1   Altered sleeping 3 0 0 2 0  Tired, decreased energy 2 0 2 2 0  Change in appetite 0 0 0 0 0  Feeling bad or failure about yourself  0 0 0 2 0  Trouble concentrating 0 0 0 - 0  Moving slowly or fidgety/restless 0 0 0 0 0  Suicidal thoughts 0 0 0 0 0  PHQ-9 Score 5 0 4 10 1   Difficult doing work/chores Somewhat difficult Not difficult at all Somewhat difficult Somewhat difficult -   PHQ-2/9 Result is positive.    Fall Risk: Fall Risk  03/13/2019 02/15/2018 09/11/2017 02/08/2017 03/20/2016  Falls in the past year? 0 0 No Yes No  Number falls in past yr: 0 0 - 1 -  Injury with Fall? 0 0 - Yes -  Follow up Falls evaluation completed - - - -    Assessment & Plan  1. Viral upper respiratory tract infection - COVID-19 testing is scheduled for tomorrow per his report; quarantine until cleared.  OTC symptom management discussed in detail, drink plenty of fluids and rest.  2. Panic disorder with agoraphobia and  severe panic attacks - See AVS for detail on switching timing of dosing. - busPIRone (BUSPAR) 7.5 MG tablet; Take 1 tablet (7.5 mg total) by mouth at bedtime.  Dispense: 90 tablet; Refill: 3 - escitalopram (LEXAPRO) 20 MG tablet; Take 1 tablet (20 mg total) by mouth daily.  Dispense: 90 tablet; Refill: 3 - propranolol (INDERAL) 20 MG tablet; Take 1 tablet (20 mg total) by mouth 3 (three) times daily.  Dispense: 270 tablet; Refill: 3   I discussed the assessment and treatment plan with the patient. The patient was provided an opportunity to ask questions and all were answered. The patient agreed with the plan and demonstrated an understanding of the instructions.  The patient was advised to call back or seek an in-person evaluation if the symptoms worsen or if the condition fails to improve as anticipated.  I provided 16 minutes of non-face-to-face time during this encounter.

## 2019-03-13 NOTE — Patient Instructions (Signed)
Try buspar at night only for 3-4 weeks.  If still not improving, then switch lexapro to nighttime dosing as well for 3-4 weeks.

## 2019-03-14 ENCOUNTER — Ambulatory Visit: Payer: BC Managed Care – PPO | Attending: Internal Medicine

## 2019-03-14 DIAGNOSIS — Z20822 Contact with and (suspected) exposure to covid-19: Secondary | ICD-10-CM

## 2019-03-15 LAB — NOVEL CORONAVIRUS, NAA: SARS-CoV-2, NAA: NOT DETECTED

## 2019-11-05 ENCOUNTER — Telehealth (INDEPENDENT_AMBULATORY_CARE_PROVIDER_SITE_OTHER): Payer: BC Managed Care – PPO | Admitting: Family Medicine

## 2019-11-05 ENCOUNTER — Encounter: Payer: Self-pay | Admitting: Family Medicine

## 2019-11-05 VITALS — HR 62 | Ht 70.0 in | Wt 285.0 lb

## 2019-11-05 DIAGNOSIS — R05 Cough: Secondary | ICD-10-CM | POA: Diagnosis not present

## 2019-11-05 DIAGNOSIS — J9801 Acute bronchospasm: Secondary | ICD-10-CM | POA: Diagnosis not present

## 2019-11-05 DIAGNOSIS — R059 Cough, unspecified: Secondary | ICD-10-CM

## 2019-11-05 MED ORDER — ALBUTEROL SULFATE HFA 108 (90 BASE) MCG/ACT IN AERS: 2.0000 | INHALATION_SPRAY | RESPIRATORY_TRACT | 1 refills | Status: DC | PRN

## 2019-11-05 MED ORDER — PREDNISONE 20 MG PO TABS
40.0000 mg | ORAL_TABLET | Freq: Every day | ORAL | 0 refills | Status: AC
Start: 2019-11-05 — End: 2019-11-10

## 2019-11-05 MED ORDER — BENZONATATE 100 MG PO CAPS: 100.0000 mg | ORAL_CAPSULE | Freq: Three times a day (TID) | ORAL | 0 refills | Status: DC | PRN

## 2019-11-05 NOTE — Progress Notes (Signed)
Name: Jacob Peters   MRN: [id number]    DOB: Nov 30, 1995   Date:11/05/2019       Progress Note  Subjective:    Chief Complaint  Chief Complaint  Patient presents with  . Cough    and chest congestion onset for 4 weeks pt states unchanged    I connected with  [name]  on 11/05/19 at  3:00 PM EDT by a video enabled telemedicine application and verified that I am speaking with the correct person using two identifiers.  I discussed the limitations of evaluation and management by telemedicine and the availability of in person appointments. The patient expressed understanding and agreed to proceed. Staff also discussed with the patient that there may be a patient responsible charge related to this service. Patient Location: Home  Provider Location: [facility] Additional Individuals present: none  About 4-4.5 weeks ago he had cold/congestion and sinus sx with cough sx, sinus sx comes for a week or so, but cough has been persistent for the entire time Really violent coughing spells, triggered easily here and there with even a tickle in his throat   Cough The cough is productive of sputum and productive of purulent sputum (getting thinner and more clear).   Pt presents with cough and chest congestion Not vaccinated for COVID but did have COVID about 4 months ago.  Hx of palpitations and heart rhythm issues he states - he's very careful with meds that might affect heart -this is limited some of his over-the-counter treatment for his persistent symptoms    Patient Active Problem List   Diagnosis Date Noted  . Anxiety about health 03/05/2016  . 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 Diarrhea 03/02/2016  . Preventative health care 09/17/2015  . Foot pain, left 09/17/2015  . 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 Obesity 09/17/2015  . Screening for STD (sexually transmitted disease) 03/22/2015  . Awareness of heartbeats  06/09/2013  . Palpitations 06/09/2013  . Anxiety 03/26/2013  . Panic anxiety syndrome 03/26/2013    Social History   Tobacco Use  . Smoking status: Current Every Day Smoker    Packs/day: 0.50    Years: 5.00    Pack years: 2.50    Types: Cigarettes  . Smokeless tobacco: Current User    Types: Chew  . Tobacco comment: smoked 1 pack a week and chewed tobacco until Friday  Substance Use Topics  . Alcohol use: Not Currently    Alcohol/week: 30.0 standard drinks    Types: 30 Cans of beer per week     Current Outpatient Medications:  .  busPIRone (BUSPAR) 7.5 MG tablet, Take 1 tablet (7.5 mg total) by mouth at bedtime., Disp: 90 tablet, Rfl: 3 .  escitalopram (LEXAPRO) 20 MG tablet, Take 1 tablet (20 mg total) by mouth daily., Disp: 90 tablet, Rfl: 3 .  ibuprofen (ADVIL,MOTRIN) 600 MG tablet, Take 1 tablet (600 mg total) by mouth every 6 (six) hours as needed., Disp: 30 tablet, Rfl: 0 .  propranolol (INDERAL) 20 MG tablet, Take 1 tablet (20 mg total) by mouth 3 (three) times daily., Disp: 270 tablet, Rfl: 3 .  albuterol (VENTOLIN HFA) 108 (90 Base) MCG/ACT inhaler, Inhale 2 puffs into the lungs every 4 (four) hours as needed for wheezing or shortness of breath., Disp: 18 g, Rfl: 1 .  benzonatate (TESSALON) 100 MG capsule, Take 1 capsule (100 mg total) by mouth 3 (three) times daily as needed for  cough., Disp: 30 capsule, Rfl: 0 .  predniSONE (DELTASONE) 20 MG tablet, Take 2 tablets (40 mg total) by mouth daily with breakfast for 5 days., Disp: 10 tablet, Rfl: 0  No Known Allergies  I personally reviewed active problem list, medication list, allergies, family history, social history, health maintenance, notes from last encounter, lab results, imaging with the patient/caregiver today.   Review of Systems  Constitutional: Negative.   HENT: Negative.   Eyes: Negative.   Respiratory: Positive for cough.   Cardiovascular: Negative.   Gastrointestinal: Negative.   Endocrine: Negative.    Genitourinary: Negative.   Musculoskeletal: Negative.   Skin: Negative.   Allergic/Immunologic: Negative.   Neurological: Negative.   Hematological: Negative.   Psychiatric/Behavioral: Negative.   All other systems reviewed and are negative.     Objective:   Virtual encounter, vitals limited, only able to obtain the following Today's Vitals   11/05/19 1454  Pulse: 62  SpO2: 98%  Weight: 285 lb (129.3 kg)  Height: 5\' 10"  (1.778 m)   Body mass index is 40.89 kg/m. Nursing Note and Vital Signs reviewed.  Physical Exam Vitals and nursing note reviewed.  Constitutional:      General: He is not in acute distress.    Appearance: Normal appearance. He is obese. He is not ill-appearing, toxic-appearing or diaphoretic.     Comments: Well appearing young male, NAD  HENT:     Head: Normocephalic and atraumatic.  Pulmonary:     Effort: No respiratory distress.     Comments: No coughing throughout encounter Neurological:     Mental Status: He is alert.  Psychiatric:        Mood and Affect: Mood normal.        Behavior: Behavior normal.     PE limited by telephone encounter  No results found for this or any previous visit (from the past 72 hour(s)).  Assessment and Plan:     ICD-10-CM   1. Cough  R05 benzonatate (TESSALON) 100 MG capsule    predniSONE (DELTASONE) 20 MG tablet  2. Bronchospasm  J98.01 predniSONE (DELTASONE) 20 MG tablet    albuterol (VENTOLIN HFA) 108 (90 Base) MCG/ACT inhaler    URI sx about a month ago, recently stopped smoking, hx of COVID 4 months ago - suspect pt has some lingering cough/bronchitis sx, possibly bronchospasms the way he describes coughing fits and has hx of smoking  May also be cough secondary to cutting back on smoking recently  Overall sx seem to be very gradually improving Encouraged him to try tessalon, mucinex and prednisone - add albuterol inhaler if still having coughing fits No red flags concerning for CAP  F/up in 2-3  weeks if not improving   -Red flags and when to present for emergency care or RTC including fever >101.29F, chest pain, shortness of breath, new/worsening/un-resolving symptoms, reviewed with patient at time of visit. Follow up and care instructions discussed and provided in AVS. - I discussed the assessment and treatment plan with the patient. The patient was provided an opportunity to ask questions and all were answered. The patient agreed with the plan and demonstrated an understanding of the instructions.  I provided 20+ minutes of non-face-to-face time during this encounter.  , PA-C 11/05/19 3:40 PM

## 2020-02-11 IMAGING — RF DG FLUORO GUIDE LUMBAR PUNCTURE
1 series · 1 of 1 positions shown · non-contrast
Comparison: none

CLINICAL DATA: Fever and headache.

[Series 1: fluoro_iodine 2fps_bw · 0.18mm/px · 1 of 1 slices shown]
[im 1/1]
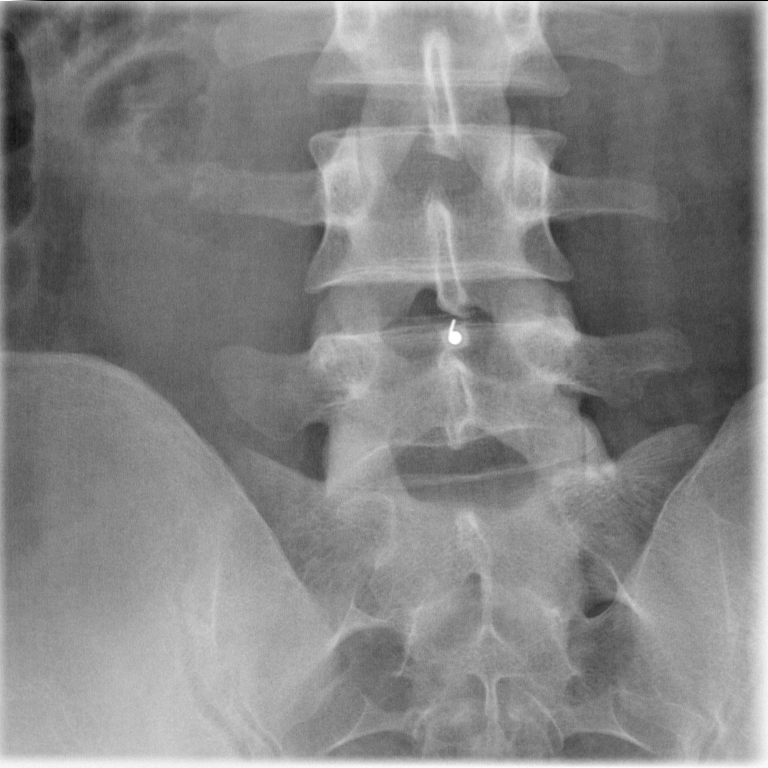

[1 of 1 positions shown; findings below may reference images not displayed]

EXAM:
DIAGNOSTIC LUMBAR PUNCTURE UNDER FLUOROSCOPIC GUIDANCE

FLUOROSCOPY TIME:  Fluoroscopy Time:  0 minutes 24 seconds.

Scratched number of Acquired Spot Images: 1

PROCEDURE:
Informed consent was obtained from the patient prior to the
procedure, including potential complications of headache, allergy,
and pain. With the patient prone, the lower back was prepped with
Betadine. 1% Lidocaine was used for local anesthesia. Lumbar
puncture was performed at the L4-L5 level using a 22 gauge needle
with return of clear CSF. Nine ml of CSF were obtained for
laboratory studies. The patient tolerated the procedure well and
there were no apparent complications. Patient was sent back to the
emergency room in stable condition.
IMPRESSION: Successful fluoroscopically guided lumbar puncture.

## 2020-03-17 ENCOUNTER — Other Ambulatory Visit: Payer: BLUE CROSS/BLUE SHIELD

## 2020-03-17 DIAGNOSIS — Z20822 Contact with and (suspected) exposure to covid-19: Secondary | ICD-10-CM

## 2020-03-19 LAB — SARS-COV-2, NAA 2 DAY TAT

## 2020-03-19 LAB — NOVEL CORONAVIRUS, NAA: SARS-CoV-2, NAA: DETECTED — AB

## 2020-03-22 ENCOUNTER — Telehealth: Payer: Self-pay

## 2020-03-22 NOTE — Telephone Encounter (Signed)
Pt will need appt for refills has not been seen in a yr for f/u only 1 sick visit.

## 2020-03-22 NOTE — Telephone Encounter (Signed)
lvm to have pt call the office to schedule an appt for med refills

## 2020-03-23 ENCOUNTER — Other Ambulatory Visit: Payer: Self-pay | Admitting: Family Medicine

## 2020-03-23 DIAGNOSIS — F4001 Agoraphobia with panic disorder: Secondary | ICD-10-CM

## 2020-03-23 NOTE — Telephone Encounter (Signed)
Patient called to inform the doctor that he did make an appt. For his medication refill which is 01/31.  Patient would like a courtesy refill until that time.  Please advise and call patient to discuss at [phone]

## 2020-03-29 ENCOUNTER — Other Ambulatory Visit: Payer: Self-pay

## 2020-03-29 ENCOUNTER — Telehealth (INDEPENDENT_AMBULATORY_CARE_PROVIDER_SITE_OTHER): Payer: BC Managed Care – PPO | Admitting: Family Medicine

## 2020-03-29 ENCOUNTER — Encounter: Payer: Self-pay | Admitting: Family Medicine

## 2020-03-29 VITALS — HR 62 | Ht 70.0 in | Wt 280.0 lb

## 2020-03-29 DIAGNOSIS — F410 Panic disorder [episodic paroxysmal anxiety] without agoraphobia: Secondary | ICD-10-CM

## 2020-03-29 DIAGNOSIS — R002 Palpitations: Secondary | ICD-10-CM

## 2020-03-29 DIAGNOSIS — F4001 Agoraphobia with panic disorder: Secondary | ICD-10-CM

## 2020-03-29 DIAGNOSIS — F17200 Nicotine dependence, unspecified, uncomplicated: Secondary | ICD-10-CM

## 2020-03-29 DIAGNOSIS — F418 Other specified anxiety disorders: Secondary | ICD-10-CM

## 2020-03-29 MED ORDER — PROPRANOLOL HCL 20 MG PO TABS: 20.0000 mg | ORAL_TABLET | Freq: Three times a day (TID) | ORAL | 1 refills | Status: DC

## 2020-03-29 MED ORDER — BUSPIRONE HCL 7.5 MG PO TABS: 7.5000 mg | ORAL_TABLET | Freq: Every day | ORAL | 1 refills | Status: DC

## 2020-03-29 MED ORDER — ESCITALOPRAM OXALATE 20 MG PO TABS: 20.0000 mg | ORAL_TABLET | Freq: Every day | ORAL | 1 refills | Status: DC

## 2020-03-29 NOTE — Progress Notes (Signed)
Name: Jacob Peters   MRN: [id number]    DOB: [birth year]   Date:03/29/2020       Progress Note  Subjective:    Chief Complaint  Chief Complaint  Patient presents with  . Medication Refill  . Depression    I connected with  [name]  on 03/29/20 at  9:40 AM EST by a video enabled telemedicine application and verified that I am speaking with the correct person using two identifiers.  I discussed the limitations of evaluation and management by telemedicine and the availability of in person appointments. The patient expressed understanding and agreed to proceed. Staff also discussed with the patient that there may be a patient responsible charge related to this service. Patient Location:  home Provider Location:   [facility] office Additional Individuals present: none   HPI Anxiety/depression: Med refill for lexapro and buspar Ran out of lexapro - 20 mg dose, buspar 7.5 mg once a day at bedtime  Depression screen [facility] 2/9 03/29/2020 11/05/2019 03/13/2019  Decreased Interest 0 0 0  Down, Depressed, Hopeless 0 0 0  PHQ - 2 Score 0 0 0  Altered sleeping 0 0 3  Tired, decreased energy 1 0 2  Change in appetite 0 0 0  Feeling bad or failure about yourself  0 0 0  Trouble concentrating 0 0 0  Moving slowly or fidgety/restless 0 0 0  Suicidal thoughts 0 0 0  PHQ-9 Score 1 0 5  Difficult doing work/chores Not difficult at all Not difficult at all Somewhat difficult  Some recent data might be hidden   GAD 7 : Generalized Anxiety Score 03/29/2020  Nervous, Anxious, on Edge 0  Control/stop worrying 0  Worry too much - different things 0  Trouble relaxing 0  Restless 0  Easily annoyed or irritable 0  Afraid - awful might happen 0  Total GAD 7 Score 0  Anxiety Difficulty Not difficult at all   Not working with psych - he notes sx better since he is not needing benzos to help with panic attacks - functioning well   Palpitations managed with propanolol 20 mg TID - he still some  palpitations but he feels its well controlled with current meds and dosing HR ranges 140-150 when he first wakes up and gets ready for the day and then he takes meds and HR the rest of the day is usually 55-65 - no SE with meds, denies SOB, near syncope Most days he is taking propanolol 2x a day, he'll take a 3rd dose if sx or if he feels his HR is high  Current smoker, recent COVID URI - smokes 1/2 ppd   Patient Active Problem List   Diagnosis Date Noted  . Anxiety about health 03/05/2016  . 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 Diarrhea 03/02/2016  . Preventative health care 09/17/2015  . Foot pain, left 09/17/2015  . 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 Obesity 09/17/2015  . Screening for STD (sexually transmitted disease) 03/22/2015  . Awareness of heartbeats 06/09/2013  . Palpitations 06/09/2013  . Anxiety 03/26/2013  . Panic anxiety syndrome 03/26/2013    Social History   Tobacco Use  . Smoking status: Current Every Day Smoker    Packs/day: 0.50    Years: 5.00    Pack years: 2.50    Types: Cigarettes  . Smokeless tobacco: Current User    Types: Chew  . Tobacco comment: smoked 1 pack a week  and chewed tobacco until Friday  Substance Use Topics  . Alcohol use: Not Currently    Alcohol/week: 30.0 standard drinks    Types: 30 Cans of beer per week     Current Outpatient Medications:  .  busPIRone (BUSPAR) 7.5 MG tablet, Take 1 tablet (7.5 mg total) by mouth at bedtime., Disp: 90 tablet, Rfl: 3 .  escitalopram (LEXAPRO) 20 MG tablet, Take 1 tablet (20 mg total) by mouth daily., Disp: 90 tablet, Rfl: 3 .  propranolol (INDERAL) 20 MG tablet, Take 1 tablet (20 mg total) by mouth 3 (three) times daily., Disp: 270 tablet, Rfl: 3 .  albuterol (VENTOLIN HFA) 108 (90 Base) MCG/ACT inhaler, Inhale 2 puffs into the lungs every 4 (four) hours as needed for wheezing or shortness of breath. (Patient not taking: Reported on 03/29/2020),  Disp: 18 g, Rfl: 1 .  benzonatate (TESSALON) 100 MG capsule, Take 1 capsule (100 mg total) by mouth 3 (three) times daily as needed for cough. (Patient not taking: Reported on 03/29/2020), Disp: 30 capsule, Rfl: 0 .  ibuprofen (ADVIL,MOTRIN) 600 MG tablet, Take 1 tablet (600 mg total) by mouth every 6 (six) hours as needed. (Patient not taking: Reported on 03/29/2020), Disp: 30 tablet, Rfl: 0  No Known Allergies  I personally reviewed active problem list, medication list, allergies, family history, social history, health maintenance, notes from last encounter, lab results, imaging with the patient/caregiver today.   Review of Systems  Constitutional: Negative.   HENT: Negative.   Eyes: Negative.   Respiratory: Negative.   Cardiovascular: Negative.   Gastrointestinal: Negative.   Endocrine: Negative.   Genitourinary: Negative.   Musculoskeletal: Negative.   Skin: Negative.   Allergic/Immunologic: Negative.   Neurological: Negative.   Hematological: Negative.   Psychiatric/Behavioral: Negative.   All other systems reviewed and are negative.     Objective:   Virtual encounter, vitals limited, only able to obtain the following Today's Vitals   03/29/20 1004  Pulse: 62  Weight: 280 lb (127 kg)  Height: 5\' 10"  (1.778 m)   Body mass index is 40.18 kg/m. Nursing Note and Vital Signs reviewed.  Physical Exam Vitals and nursing note reviewed.  Neck:     Trachea: Phonation normal.  Pulmonary:     Effort: No respiratory distress.  Neurological:     Mental Status: He is alert.  Psychiatric:        Attention and Perception: Attention normal.        Mood and Affect: Mood normal.        Speech: Speech normal.        Behavior: Behavior normal. Behavior is cooperative.        Thought Content: Thought content does not include homicidal or suicidal ideation. Thought content does not include homicidal or suicidal plan.     PE limited by telephone encounter  No results found for  this or any previous visit (from the past 72 hour(s)).  Assessment and Plan:     ICD-10-CM   1. Palpitations  R00.2 propranolol (INDERAL) 20 MG tablet   managed with propanolol TID - usually taking BID and an extra dose as needed, no in office visit for over 2 years - needs in person appt  2. Panic disorder with agoraphobia and severe panic attacks  F40.01 busPIRone (BUSPAR) 7.5 MG tablet    escitalopram (LEXAPRO) 20 MG tablet   pt feels sx are better than past couple years, not as many panic attacks, well controlled with lexapro and buspar  3. Panic anxiety syndrome  F41.0 busPIRone (BUSPAR) 7.5 MG tablet    escitalopram (LEXAPRO) 20 MG tablet   see above - PHQ and GAD7 reviewed  4. Anxiety about health  F41.8 busPIRone (BUSPAR) 7.5 MG tablet    escitalopram (LEXAPRO) 20 MG tablet   see above - currently well controlled, pt states better than over the past couple years, not currently est with psych or therapist  5. Current smoker  F17.200    not interested in smoking cessation - discussed, declined    Meds refilled - I discussed with the patient that he has not been seen in clinic in person for over 2 years and he will need to come in person in the next 6 months or ALT that is not safe to continue to refill his medications without physical exam and full vital signs.  Return for needs in person OV in the next 6 months (must be in person no visit in office x 2 years).    - I discussed the assessment and treatment plan with the patient. The patient was provided an opportunity to ask questions and all were answered. The patient agreed with the plan and demonstrated an understanding of the instructions.  I provided 25+ minutes of non-face-to-face time during this encounter.  Danelle Berry, PA-C 03/29/20 9:35 AM

## 2020-04-19 ENCOUNTER — Telehealth: Payer: Self-pay | Admitting: Cardiovascular Disease

## 2020-04-19 NOTE — Telephone Encounter (Signed)
Patient has been contacted at least 3 times for a recall, recall has been deleted  

## 2020-10-03 ENCOUNTER — Other Ambulatory Visit: Payer: Self-pay | Admitting: Family Medicine

## 2020-10-03 DIAGNOSIS — F418 Other specified anxiety disorders: Secondary | ICD-10-CM

## 2020-10-03 DIAGNOSIS — F4001 Agoraphobia with panic disorder: Secondary | ICD-10-CM

## 2020-10-03 DIAGNOSIS — F410 Panic disorder [episodic paroxysmal anxiety] without agoraphobia: Secondary | ICD-10-CM

## 2020-11-05 DIAGNOSIS — Z03818 Encounter for observation for suspected exposure to other biological agents ruled out: Secondary | ICD-10-CM | POA: Diagnosis not present

## 2020-11-05 DIAGNOSIS — J209 Acute bronchitis, unspecified: Secondary | ICD-10-CM | POA: Diagnosis not present

## 2020-11-05 DIAGNOSIS — J019 Acute sinusitis, unspecified: Secondary | ICD-10-CM | POA: Diagnosis not present

## 2020-11-05 DIAGNOSIS — J029 Acute pharyngitis, unspecified: Secondary | ICD-10-CM | POA: Diagnosis not present

## 2020-12-10 ENCOUNTER — Ambulatory Visit
Admission: EM | Admit: 2020-12-10 | Discharge: 2020-12-10 | Disposition: A | Discharge status: Home / Self Care | Payer: BC Managed Care – PPO | Attending: Physician Assistant | Admitting: Physician Assistant

## 2020-12-10 ENCOUNTER — Ambulatory Visit (INDEPENDENT_AMBULATORY_CARE_PROVIDER_SITE_OTHER): Payer: BC Managed Care – PPO

## 2020-12-10 ENCOUNTER — Other Ambulatory Visit: Payer: Self-pay

## 2020-12-10 DIAGNOSIS — M542 Cervicalgia: Secondary | ICD-10-CM

## 2020-12-10 NOTE — ED Provider Notes (Signed)
[facility]    CSN: [id number] Arrival date & time: 12/10/20  1759      History   Chief Complaint Chief Complaint  Patient presents with   Neck Pain    HPI Jacob Peters is a 25 y.o. male presenting for an tracheal pain x2 days.  Patient states that he was laying on his bed with his neck turned to the right when he suddenly sneezed with his mouth closed and bear down.  He has since had pain when he moves the trachea when he swallows.  Has been taking ibuprofen which is somewhat help with the pain.  He denies any posterior cervical pain.  He has not had any choking, chest pain or breathing difficulty.  Denies any hematemesis or hemoptysis.  No other complaints.  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 Authorizing Provider  busPIRone (BUSPAR) 7.5 MG tablet Take 1 tablet (7.5 mg total) by mouth at bedtime. 03/29/20  Yes Danelle Berry, PA-C  escitalopram (LEXAPRO) 20 MG tablet  Take 1 tablet by mouth once daily 10/04/20  Yes Danelle Berry, PA-C  propranolol (INDERAL) 20 MG tablet Take 1 tablet (20 mg total) by mouth 3 (three) times daily. 03/29/20  Yes Danelle Berry, PA-C    Family History Family History  Problem Relation Age of Onset   Anxiety disorder Mother    Hypertension Father    Diabetes Father    Anxiety disorder Sister    Heart disease Paternal Uncle    Atrial fibrillation Maternal Grandmother    Alcohol abuse Maternal Grandmother    Heart disease Paternal Grandfather     Social History Social History   Tobacco Use   Smoking status: Every Day    Packs/day: 0.50    Years: 5.00    Pack years: 2.50    Types: Cigarettes   Smokeless tobacco: Current    Types: Chew   Tobacco comments:    smoked 1 pack a week and chewed tobacco until Friday  Vaping Use   Vaping Use: Former  Substance Use Topics   Alcohol use: Not Currently    Alcohol/week: 30.0 standard drinks    Types: 30 Cans of beer per week   Drug use: No     Allergies   Patient has no known allergies.   Review of Systems Review of Systems  Constitutional:  Negative for fatigue and fever.  HENT:  Negative for congestion, sore throat  and trouble swallowing.   Respiratory:  Negative for cough.   Musculoskeletal:  Positive for neck pain. Negative for neck stiffness.  Skin:  Negative for rash.  Neurological:  Negative for weakness and numbness.    Physical Exam Triage Vital Signs ED Triage Vitals  Enc Vitals Group     BP 12/10/20 1927 (!) 136/97     Pulse Rate 12/10/20 1927 71     Resp 12/10/20 1927 18     Temp 12/10/20 1927 98.7 F (37.1 C)     Temp Source 12/10/20 1927 Oral     SpO2 12/10/20 1927 100 %     Weight 12/10/20 1926 300 lb (136.1 kg)     Height 12/10/20 1926 5\' 10"  (1.778 m)     Head Circumference --      Peak Flow --      Pain Score 12/10/20 1926 7     Pain Loc --      Pain Edu? --      Excl. in GC? --    No data found.  Updated Vital Signs BP (!)  136/97 (BP Location: Left Arm)   Pulse 71   Temp 98.7 F (37.1 C) (Oral)   Resp 18   Ht 5\' 10"  (1.778 m)   Wt 300 lb (136.1 kg)   SpO2 100%   BMI 43.05 kg/m   Physical Exam Vitals and nursing note reviewed.  Constitutional:      General: He is not in acute distress.    Appearance: Normal appearance. He is well-developed. He is not ill-appearing.  HENT:     Head: Normocephalic and atraumatic.     Mouth/Throat:     Mouth: Mucous membranes are moist.     Pharynx: Oropharynx is clear.     Comments: No tracheal deviation. No swelling. Normal swallowing. TTP left aspect of trachea Eyes:     General: No scleral icterus.    Conjunctiva/sclera: Conjunctivae normal.  Cardiovascular:     Rate and Rhythm: Normal rate and regular rhythm.     Heart sounds: Normal heart sounds.  Pulmonary:     Effort: Pulmonary effort is normal. No respiratory distress.     Breath sounds: Normal breath sounds.  Musculoskeletal:     Cervical back: Neck supple.  Skin:    General: Skin is warm and dry.  Neurological:     General: No focal deficit present.     Mental Status: He is alert. Mental status is at baseline.     Motor: No weakness.     Coordination: Coordination normal.     Gait: Gait normal.  Psychiatric:        Mood and Affect: Mood normal.        Behavior: Behavior normal.        Thought Content: Thought content normal.     UC Treatments / Results  Labs (all labs ordered are listed, but only abnormal results are displayed) Labs Reviewed - No data to display  EKG   Radiology DG Neck Soft Tissue  Result Date: 12/10/2020 CLINICAL DATA:  Neck pain following sneezing, initial encounter EXAM: NECK SOFT TISSUES - 1+ VIEW COMPARISON:  None. FINDINGS: There is no evidence of retropharyngeal soft tissue swelling or epiglottic enlargement. The cervical airway is unremarkable and no radio-opaque foreign body identified. IMPRESSION: No acute abnormality noted. Electronically Signed   By: M.D.   On: 12/10/2020 20:07    Procedures Procedures (including critical care time)  Medications Ordered in  UC Medications - No data to display  Initial Impression / Assessment and Plan / UC Course  I have reviewed the triage vital signs and the nursing notes.  Pertinent labs & imaging results that were available during my care of the patient were reviewed by me and considered in my medical decision making (see chart for details).  25 year old male presenting for tracheal pain following an incident where he had his neck turned to the right and sneezed with a closed mouth, bearing down.  On exam he does have tenderness to palpation to the lateral left aspect of the trachea.  X-ray of soft tissue neck obtained.  X-ray normal.  Results with patient.  Advise supportive care at this time.  Suggest ibuprofen and/or Tylenol, cool liquids, soft and small bites of food.  Reviewed return and ED precautions, otherwise follow-up with PCP if not improving over the next week or so.  Final Clinical Impressions(s) / UC Diagnoses   Final diagnoses:  Neck pain     Discharge Instructions      -Your x-ray today is normal.  Normal-appearing airway on x-ray. -Continue ibuprofen and supportive care at home.  Can also take Tylenol and apply ice to your anterior neck. -Cool liquids and rest. -If symptoms continue, please follow-up with your primary care provider. -For any acute worsening of symptoms or difficulty swallowing or breathing, please call [phone] or go to emergency department.     ED Prescriptions   None    PDMP not reviewed this encounter.   Shirlee Latch, PA-C 12/10/20 2029

## 2020-12-10 NOTE — ED Triage Notes (Signed)
Pt here with C/O neck pain, couple days ago sneezed with neck turned and has had pain on left side of neck since.

## 2020-12-10 NOTE — Discharge Instructions (Addendum)
-  Your x-ray today is normal.  Normal-appearing airway on x-ray. -Continue ibuprofen and supportive care at home.  Can also take Tylenol and apply ice to your anterior neck. -Cool liquids and rest. -If symptoms continue, please follow-up with your primary care provider. -For any acute worsening of symptoms or difficulty swallowing or breathing, please call [phone] or go to emergency department.

## 2021-01-11 ENCOUNTER — Other Ambulatory Visit: Payer: Self-pay | Admitting: Family Medicine

## 2021-01-11 DIAGNOSIS — F4001 Agoraphobia with panic disorder: Secondary | ICD-10-CM

## 2021-01-11 DIAGNOSIS — F418 Other specified anxiety disorders: Secondary | ICD-10-CM

## 2021-01-11 DIAGNOSIS — F410 Panic disorder [episodic paroxysmal anxiety] without agoraphobia: Secondary | ICD-10-CM

## 2021-01-11 NOTE — Telephone Encounter (Signed)
Requested medications are due for refill today.  yeys  Requested medications are on the active medications list.  yes  Last refill. 10/04/2020  Future visit scheduled.   no  Notes to clinic.  Pt last seen 03/29/2020 - more than 3 months overdue for OV.

## 2021-01-12 ENCOUNTER — Ambulatory Visit: Payer: Self-pay | Admitting: *Deleted

## 2021-01-12 NOTE — Telephone Encounter (Signed)
C/o cough x 2 weeks . Took covid test and was negative. Requesting a call back for advise.    Called patient to review symptoms. No answer, left message on voicemail to call clinic back #[phone].

## 2021-01-12 NOTE — Telephone Encounter (Signed)
Called pt no answer, left vm to call office to get scheduled for a mychart video appointment.

## 2021-01-12 NOTE — Telephone Encounter (Signed)
Pt needs an appointment for further refills. Thank You!

## 2021-01-12 NOTE — Telephone Encounter (Signed)
2nd call attempted to review symptoms. No answer, left message on voicemail to call clinic back #[phone].

## 2021-01-12 NOTE — Telephone Encounter (Signed)
Not able to reach pt to sch virtual appt

## 2021-01-12 NOTE — Telephone Encounter (Signed)
Lvm pt needs appt for refills

## 2021-01-12 NOTE — Telephone Encounter (Signed)
3 rd attempt to contact patient to review symptoms of cough x 2 weeks. No answer, left message on voicemail x 3 to call clinic back #[phone].

## 2021-01-13 ENCOUNTER — Telehealth: Payer: Self-pay

## 2021-01-13 NOTE — Telephone Encounter (Signed)
No answer from pt, sent a mychart message to pt to call our office to get scheduled for an appointment through mychart.

## 2021-01-13 NOTE — Telephone Encounter (Signed)
Lvm for pt to call and schedule an appt     Copied from CRM [id number]. Topic: Appointment Scheduling - Scheduling Inquiry for Clinic >> Jan 13, 2021 11:16 AM Jacob Peters, [name] wrote: Reason for CRM: Patient would like a f/u work-in appt today via video for cough and med refill. Please call patient back, thanks.

## 2021-01-14 ENCOUNTER — Ambulatory Visit (INDEPENDENT_AMBULATORY_CARE_PROVIDER_SITE_OTHER): Payer: Self-pay | Admitting: Nurse Practitioner

## 2021-01-14 ENCOUNTER — Other Ambulatory Visit: Payer: Self-pay

## 2021-01-14 ENCOUNTER — Encounter: Payer: Self-pay | Admitting: Nurse Practitioner

## 2021-01-14 VITALS — BP 132/80 | HR 90 | Temp 98.0°F | Resp 16 | Ht 70.0 in | Wt 296.0 lb

## 2021-01-14 DIAGNOSIS — F419 Anxiety disorder, unspecified: Secondary | ICD-10-CM

## 2021-01-14 DIAGNOSIS — R002 Palpitations: Secondary | ICD-10-CM

## 2021-01-14 MED ORDER — PROPRANOLOL HCL 20 MG PO TABS: 20.0000 mg | ORAL_TABLET | Freq: Three times a day (TID) | ORAL | 1 refills | Status: DC

## 2021-01-14 MED ORDER — ESCITALOPRAM OXALATE 20 MG PO TABS: 20.0000 mg | ORAL_TABLET | Freq: Every day | ORAL | 3 refills | Status: DC

## 2021-01-14 NOTE — Progress Notes (Signed)
BP 132/80   Pulse 90   Temp 98 F (36.7 C) (Oral)   Resp 16   Ht 5\' 10"  (1.778 m)   Wt 296 lb (134.3 kg)   SpO2 97%   BMI 42.47 kg/m    Subjective:    Patient ID: , male    DOB: March 05, 1995, 25 y.o.   MRN: [id number]  HPI: Jacob Peters is a 25 y.o. male, here alone  Chief Complaint  Patient presents with   Anxiety   Anxiety: He says that his anxiety has been fine.  He says he super focuses on his heart rate.  He says that he stopped taking the Buspar because he said it didn't feel like it did anything for him.  He is taking the Lexapro 20 mg daily.  His GAD score today is 2 and his PHQ9 is 2.  He denies any suicidal thoughts. Will refill medication.   GAD 7 : Generalized Anxiety Score 01/14/2021 03/29/2020  Nervous, Anxious, on Edge 1 0  Control/stop worrying 0 0  Worry too much - different things 0 0  Trouble relaxing 0 0  Restless 0 0  Easily annoyed or irritable 1 0  Afraid - awful might happen 0 0  Total GAD 7 Score 2 0  Anxiety Difficulty Not difficult at all Not difficult at all     Depression screen [facility] 2/9 01/14/2021 03/29/2020 11/05/2019 03/13/2019 02/15/2018  Decreased Interest 0 0 0 0 0  Down, Depressed, Hopeless 0 0 0 0 0  PHQ - 2 Score 0 0 0 0 0  Altered sleeping 1 0 0 3 0  Tired, decreased energy 1 1 0 2 0  Change in appetite 0 0 0 0 0  Feeling bad or failure about yourself  0 0 0 0 0  Trouble concentrating 0 0 0 0 0  Moving slowly or fidgety/restless 0 0 0 0 0  Suicidal thoughts 0 0 0 0 0  PHQ-9 Score 2 1 0 5 0  Difficult doing work/chores Somewhat difficult Not difficult at all Not difficult at all Somewhat difficult Not difficult at all  Some recent data might be hidden    Palpitations:  He says he has had palpations since he was [age].  He says he has worn a Holter monitor, had a stress test and an echocardiogram. He says they never found anything.  He says the propranolol does help.  He says over the years he has been to multiple  cardiologists.  He denies any chest pain or shortness of breath.   Relevant past medical, surgical, family and social history reviewed and updated as indicated. Interim medical history since our last visit reviewed. Allergies and medications reviewed and updated.  Review of Systems  Constitutional: Negative for fever or weight change.  Respiratory: Negative for cough and shortness of breath.   Cardiovascular: Negative for chest pain, positive for palpitations.  Gastrointestinal: Negative for abdominal pain, no bowel changes.  Musculoskeletal: Negative for gait problem or joint swelling.  Skin: Negative for rash.  Neurological: Negative for dizziness or headache.  No other specific complaints in a complete review of systems (except as listed in HPI above).      Objective:    BP 132/80   Pulse 90   Temp 98 F (36.7 C) (Oral)   Resp 16   Ht 5\' 10"  (1.778 m)   Wt 296 lb (134.3 kg)   SpO2 97%   BMI 42.47 kg/m   Wt  Readings from Last 3 Encounters:  01/14/21 296 lb (134.3 kg)  12/10/20 300 lb (136.1 kg)  03/29/20 280 lb (127 kg)    Physical Exam  Constitutional: Patient appears well-developed and well-nourished. Obese No distress.  HEENT: head atraumatic, normocephalic, pupils equal and reactive to light, neck supple, throat within normal limits Cardiovascular: Normal rate, regular rhythm and normal heart sounds.  No murmur heard. No BLE edema. Pulmonary/Chest: Effort normal and breath sounds normal. No respiratory distress. Abdominal: Soft.  There is no tenderness. Psychiatric: Patient has a normal mood and affect. behavior is normal. Judgment and thought content normal.   Results for orders placed or performed in visit on 03/17/20  Novel Coronavirus, NAA ([facility])   Specimen: Nasopharyngeal(NP) swabs in vial transport medium   Nasopharynge  Result Value Ref Range   SARS-CoV-2, NAA Detected (A) Not Detected  SARS-COV-2, NAA 2 DAY TAT   Nasopharynge  Result Value Ref Range    SARS-CoV-2, NAA 2 DAY TAT Performed       Assessment & Plan:   1. Anxiety  - escitalopram (LEXAPRO) 20 MG tablet; Take 1 tablet (20 mg total) by mouth daily.  Dispense: 90 tablet; Refill: 3  2. Palpitations  - propranolol (INDERAL) 20 MG tablet; Take 1 tablet (20 mg total) by mouth 3 (three) times daily.  Dispense: 270 tablet; Refill: 1   Follow up plan: Return in about 3 months (around 04/16/2021) for cpe.

## 2021-01-14 NOTE — Telephone Encounter (Signed)
Patient called office to schedule a virtual appointment. Only available virtual appointment I can see is with Della Goo, NP at 4pm. Please reach out to the patient regarding scheduling. Best # [phone]

## 2021-01-24 ENCOUNTER — Ambulatory Visit: Payer: Self-pay | Admitting: Family Medicine

## 2021-02-17 ENCOUNTER — Encounter: Payer: Self-pay | Admitting: Internal Medicine

## 2021-02-17 ENCOUNTER — Ambulatory Visit (INDEPENDENT_AMBULATORY_CARE_PROVIDER_SITE_OTHER): Payer: BC Managed Care – PPO | Admitting: Internal Medicine

## 2021-02-17 VITALS — BP 124/84 | HR 97 | Temp 97.6°F | Resp 16 | Ht 70.0 in | Wt 296.0 lb

## 2021-02-17 DIAGNOSIS — R0681 Apnea, not elsewhere classified: Secondary | ICD-10-CM | POA: Diagnosis not present

## 2021-02-17 DIAGNOSIS — R0683 Snoring: Secondary | ICD-10-CM | POA: Diagnosis not present

## 2021-02-17 DIAGNOSIS — R5383 Other fatigue: Secondary | ICD-10-CM | POA: Diagnosis not present

## 2021-02-17 NOTE — Progress Notes (Signed)
Acute Office Visit  Subjective:    Patient ID: Jacob Peters, male    DOB: May 18, 1995, 25 y.o.   MRN: [id number]  Chief Complaint  Patient presents with   Snoring    Witnessed apnea, wants referral    HPI Patient is in today for concerns about OSA.  Snoring: yes Tired/Not feeling refreshed: Yes, every morning.  No morning headaches.  [place] observed: girlfriend noticed every night the patient will have apneic events, for a few months. Patient will wake up several times a night gasping for air. HTN: no, no current treatment BMI: 42.47  Sleeps 12 hours and still tired. Goes to sleep at 5 am and wakes up at 3 pm. Tried to go to bed earlier, but due to schedule with working in he afternoon/evening this makes it harder to have a regular sleep schedule. Also has a hard time falling asleep due to anxiety. He does take Lexapro 20 mg daily and is compliant with this medication and reports no side effects. He has been on this does for years now and feels like symptoms are well controlled but does have some break through anxiety at night. He also has heart palpitations and has been seen by Cardiology for this. He is currently on Propanolol 20 mg daily.   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   Obesity 09/17/2015   Palpitations     Past Surgical History:  Procedure Laterality Date   HAND SURGERY Right    ulnara nerve laceration exploration and nerve repair Right 09/19/2016    Family History  Problem Relation Age of Onset   Anxiety disorder Mother    Hypertension Father    Diabetes Father    Anxiety disorder Sister    Heart disease Paternal Uncle    Atrial fibrillation Maternal Grandmother    Alcohol abuse Maternal Grandmother    Heart disease Paternal Grandfather     Social  History   Socioeconomic History   Marital status: Single    Spouse name: Not on file   Number of children: 0   Years of education: Not on file   Highest education level: High school graduate  Occupational History    Comment: part time  Tobacco Use   Smoking status: Every Day    Packs/day: 0.50    Years: 5.00    Pack years: 2.50    Types: Cigarettes   Smokeless tobacco: Current    Types: Chew   Tobacco comments:    smoked 1 pack a week and chewed tobacco until Friday  Vaping Use   Vaping Use: Former  Substance and Sexual Activity   Alcohol use: Not Currently    Alcohol/week: 30.0 standard drinks    Types: 30 Cans of beer per week   Drug use: No   Sexual activity: Yes    Birth control/protection: None, Condom  Other Topics Concern   Not on file  Social History Narrative   Not on file   Social Determinants of Health   Financial Resource Strain: Not on file  Food Insecurity: Not on file  Transportation Needs: Not on file  Physical Activity: Not on file  Stress: Not on file  Social Connections: Not on file  Intimate Partner Violence: Not on file    Outpatient Medications Prior  to Visit  Medication Sig Dispense Refill   escitalopram (LEXAPRO) 20 MG tablet Take 1 tablet (20 mg total) by mouth daily. 90 tablet 3   propranolol (INDERAL) 20 MG tablet Take 1 tablet (20 mg total) by mouth 3 (three) times daily. 270 tablet 1   No facility-administered medications prior to visit.    No Known Allergies  Review of Systems  Constitutional:  Positive for fatigue. Negative for chills and fever.  Respiratory:  Positive for apnea. Negative for cough, shortness of breath and wheezing.   Cardiovascular:  Negative for chest pain and palpitations.  Psychiatric/Behavioral:  Positive for sleep disturbance. The patient is nervous/anxious.       Objective:    Physical Exam Constitutional:      Appearance: Normal appearance. He is obese.  HENT:     Head: Normocephalic and  atraumatic.  Eyes:     Conjunctiva/sclera: Conjunctivae normal.  Cardiovascular:     Rate and Rhythm: Normal rate and regular rhythm.  Pulmonary:     Effort: Pulmonary effort is normal.     Breath sounds: Normal breath sounds.  Musculoskeletal:     Right lower leg: No edema.     Left lower leg: Edema present.  Skin:    General: Skin is warm and dry.  Neurological:     General: No focal deficit present.     Mental Status: He is alert. Mental status is at baseline.  Psychiatric:        Mood and Affect: Mood normal.        Behavior: Behavior normal.    BP 124/84    Pulse 97    Temp 97.6 F (36.4 C)    Resp 16    Ht 5\' 10"  (1.778 m)    Wt 296 lb (134.3 kg)    SpO2 97%    BMI 42.47 kg/m  Wt Readings from Last 3 Encounters:  01/14/21 296 lb (134.3 kg)  12/10/20 300 lb (136.1 kg)  03/29/20 280 lb (127 kg)    Health Maintenance Due  Topic Date Due   Pneumococcal Vaccine 34-24 Years old (1 - PCV) Never done   HPV VACCINES (1 - Male 2-dose series) Never done       Topic Date Due   HPV VACCINES (1 - Male 2-dose series) Never done     Lab Results  Component Value Date   TSH 0.56 03/08/2016   Lab Results  Component Value Date   WBC 13.4 (H) 09/11/2017   HGB 16.2 09/11/2017   HCT 46.5 09/11/2017   MCV 83.7 09/11/2017   PLT 303 09/11/2017   Lab Results  Component Value Date   NA 137 09/11/2017   K 3.6 09/11/2017   CO2 26 09/11/2017   GLUCOSE 112 (H) 09/11/2017   BUN 9 09/11/2017   CREATININE 1.00 09/11/2017   BILITOT 1.4 (H) 09/11/2017   ALKPHOS 63 09/11/2017   AST 23 09/11/2017   ALT 23 09/11/2017   PROT 7.6 09/11/2017   ALBUMIN 4.4 09/11/2017   CALCIUM 9.2 09/11/2017   ANIONGAP 8 09/11/2017   Lab Results  Component Value Date   CHOL 150 09/17/2015   Lab Results  Component Value Date   HDL 40 09/17/2015   Lab Results  Component Value Date   LDLCALC 92 09/17/2015   Lab Results  Component Value Date   TRIG 91 09/17/2015   Lab Results  Component  Value Date   CHOLHDL 3.8 09/17/2015   No results found for: HGBA1C  Assessment & Plan:   1. Apnea/Snoring/Other fatigue: Referral placed to pulmonology for sleep study as the patient does have several risk factors for it. Blood work also ordered to rule out metabolic causes of fatigue.   - Ambulatory referral to Pulmonology - CBC w/Diff/Platelet - COMPLETE METABOLIC PANEL WITH GFR - Lipid Profile - TSH    Margarita Mail, DO

## 2021-02-17 NOTE — Patient Instructions (Addendum)
It was great seeing you today!  Plan discussed at today's visit: -Blood work ordered today, results will be uploaded to MyChart.  -Referral to pulmonology placed  Follow up in: as needed or for annual exam  Take care and let us know if you have any questions or concerns prior to your next visit.  Dr. Caralee Ates

## 2021-02-18 LAB — CBC WITH DIFFERENTIAL/PLATELET
Absolute Monocytes: 680 cells/uL (ref 200–950)
Basophils Absolute: 32 cells/uL (ref 0–200)
Basophils Relative: 0.4 %
Eosinophils Absolute: 160 cells/uL (ref 15–500)
Eosinophils Relative: 2 %
HCT: 47.9 % (ref 38.5–50.0)
Hemoglobin: 16.5 g/dL (ref 13.2–17.1)
Lymphs Abs: 4312 cells/uL — ABNORMAL HIGH (ref 850–3900)
MCH: 30.6 pg (ref 27.0–33.0)
MCHC: 34.4 g/dL (ref 32.0–36.0)
MCV: 88.9 fL (ref 80.0–100.0)
MPV: 10 fL (ref 7.5–12.5)
Monocytes Relative: 8.5 %
Neutro Abs: 2816 cells/uL (ref 1500–7800)
Neutrophils Relative %: 35.2 %
Platelets: 302 10*3/uL (ref 140–400)
RBC: 5.39 10*6/uL (ref 4.20–5.80)
RDW: 13 % (ref 11.0–15.0)
Total Lymphocyte: 53.9 %
WBC: 8 10*3/uL (ref 3.8–10.8)

## 2021-02-18 LAB — COMPLETE METABOLIC PANEL WITH GFR
AG Ratio: 1.8 (calc) (ref 1.0–2.5)
ALT: 47 U/L — ABNORMAL HIGH (ref 9–46)
AST: 45 U/L — ABNORMAL HIGH (ref 10–40)
Albumin: 4.5 g/dL (ref 3.6–5.1)
Alkaline phosphatase (APISO): 62 U/L (ref 36–130)
BUN: 12 mg/dL (ref 7–25)
CO2: 27 mmol/L (ref 20–32)
Calcium: 9 mg/dL (ref 8.6–10.3)
Chloride: 106 mmol/L (ref 98–110)
Creat: 0.82 mg/dL (ref 0.60–1.24)
Globulin: 2.5 g/dL (calc) (ref 1.9–3.7)
Glucose, Bld: 89 mg/dL (ref 65–99)
Potassium: 4.3 mmol/L (ref 3.5–5.3)
Sodium: 143 mmol/L (ref 135–146)
Total Bilirubin: 0.8 mg/dL (ref 0.2–1.2)
Total Protein: 7 g/dL (ref 6.1–8.1)
eGFR: 124 mL/min/{1.73_m2} (ref >=60)

## 2021-02-18 LAB — LIPID PANEL
Cholesterol: 173 mg/dL (ref <200)
HDL: 45 mg/dL (ref >=40)
LDL Cholesterol (Calc): 81 mg/dL (calc)
Non-HDL Cholesterol (Calc): 128 mg/dL (calc) (ref <130)
Total CHOL/HDL Ratio: 3.8 (calc) (ref <5.0)
Triglycerides: 389 mg/dL — ABNORMAL HIGH (ref <150)

## 2021-02-18 LAB — TSH: TSH: 2.07 mIU/L (ref 0.40–4.50)

## 2021-02-22 NOTE — Progress Notes (Signed)
Lvm to schedule 56m follow up

## 2021-03-03 ENCOUNTER — Telehealth: Payer: Self-pay

## 2021-03-03 NOTE — Telephone Encounter (Signed)
Copied from CRM [id number]. Topic: Referral - Status >> Mar 03, 2021  8:27 AM McGill, Darlina Rumpf wrote: Reason for CRM: Pt mother calling back for an update on referral for pt sleep study.

## 2021-03-09 ENCOUNTER — Other Ambulatory Visit: Payer: Self-pay

## 2021-03-09 DIAGNOSIS — R0681 Apnea, not elsewhere classified: Secondary | ICD-10-CM

## 2021-03-09 DIAGNOSIS — R5383 Other fatigue: Secondary | ICD-10-CM

## 2021-03-09 DIAGNOSIS — R0683 Snoring: Secondary | ICD-10-CM

## 2021-03-09 NOTE — Telephone Encounter (Signed)
Referral placed.

## 2021-03-09 NOTE — Telephone Encounter (Addendum)
Mother checking on the status of sleep study referral sent on 03/03/2021, mother would like referral expedited today and a follow up call regarding the process.

## 2021-05-16 DIAGNOSIS — F418 Other specified anxiety disorders: Secondary | ICD-10-CM | POA: Diagnosis not present

## 2021-05-16 DIAGNOSIS — Z720 Tobacco use: Secondary | ICD-10-CM | POA: Diagnosis not present

## 2021-05-16 DIAGNOSIS — Z0001 Encounter for general adult medical examination with abnormal findings: Secondary | ICD-10-CM | POA: Diagnosis not present

## 2021-05-17 ENCOUNTER — Other Ambulatory Visit: Payer: Self-pay

## 2021-05-17 DIAGNOSIS — R0683 Snoring: Secondary | ICD-10-CM

## 2021-05-17 DIAGNOSIS — R5383 Other fatigue: Secondary | ICD-10-CM

## 2021-05-17 DIAGNOSIS — R0681 Apnea, not elsewhere classified: Secondary | ICD-10-CM

## 2021-05-18 DIAGNOSIS — F900 Attention-deficit hyperactivity disorder, predominantly inattentive type: Secondary | ICD-10-CM | POA: Diagnosis not present

## 2021-05-18 DIAGNOSIS — F411 Generalized anxiety disorder: Secondary | ICD-10-CM | POA: Diagnosis not present

## 2021-05-18 DIAGNOSIS — F320 Major depressive disorder, single episode, mild: Secondary | ICD-10-CM | POA: Diagnosis not present

## 2021-10-18 DIAGNOSIS — F418 Other specified anxiety disorders: Secondary | ICD-10-CM | POA: Diagnosis not present

## 2021-10-18 DIAGNOSIS — F1010 Alcohol abuse, uncomplicated: Secondary | ICD-10-CM | POA: Diagnosis not present

## 2022-02-01 DIAGNOSIS — F411 Generalized anxiety disorder: Secondary | ICD-10-CM | POA: Diagnosis not present

## 2022-02-01 DIAGNOSIS — F900 Attention-deficit hyperactivity disorder, predominantly inattentive type: Secondary | ICD-10-CM | POA: Diagnosis not present

## 2022-02-01 DIAGNOSIS — F320 Major depressive disorder, single episode, mild: Secondary | ICD-10-CM | POA: Diagnosis not present

## 2022-02-16 DIAGNOSIS — R6889 Other general symptoms and signs: Secondary | ICD-10-CM | POA: Diagnosis not present

## 2022-02-16 DIAGNOSIS — Z6841 Body Mass Index (BMI) 40.0 and over, adult: Secondary | ICD-10-CM | POA: Diagnosis not present

## 2022-02-22 DIAGNOSIS — J101 Influenza due to other identified influenza virus with other respiratory manifestations: Secondary | ICD-10-CM | POA: Diagnosis not present

## 2022-02-22 DIAGNOSIS — J111 Influenza due to unidentified influenza virus with other respiratory manifestations: Secondary | ICD-10-CM | POA: Diagnosis not present

## 2022-04-06 ENCOUNTER — Other Ambulatory Visit: Payer: Self-pay | Admitting: Internal Medicine

## 2022-04-06 DIAGNOSIS — N50812 Left testicular pain: Secondary | ICD-10-CM | POA: Diagnosis not present

## 2022-04-06 DIAGNOSIS — M5489 Other dorsalgia: Secondary | ICD-10-CM | POA: Diagnosis not present

## 2022-04-06 DIAGNOSIS — N50811 Right testicular pain: Secondary | ICD-10-CM

## 2022-04-14 ENCOUNTER — Ambulatory Visit: Payer: BC Managed Care – PPO

## 2022-05-11 DIAGNOSIS — Z1389 Encounter for screening for other disorder: Secondary | ICD-10-CM | POA: Diagnosis not present

## 2022-05-11 DIAGNOSIS — Z131 Encounter for screening for diabetes mellitus: Secondary | ICD-10-CM | POA: Diagnosis not present

## 2022-05-11 DIAGNOSIS — Z0000 Encounter for general adult medical examination without abnormal findings: Secondary | ICD-10-CM | POA: Diagnosis not present

## 2022-05-11 DIAGNOSIS — Z13220 Encounter for screening for lipoid disorders: Secondary | ICD-10-CM | POA: Diagnosis not present

## 2022-05-18 DIAGNOSIS — F17210 Nicotine dependence, cigarettes, uncomplicated: Secondary | ICD-10-CM | POA: Diagnosis not present

## 2022-05-18 DIAGNOSIS — F418 Other specified anxiety disorders: Secondary | ICD-10-CM | POA: Diagnosis not present

## 2022-05-18 DIAGNOSIS — Z0001 Encounter for general adult medical examination with abnormal findings: Secondary | ICD-10-CM | POA: Diagnosis not present

## 2022-05-22 ENCOUNTER — Ambulatory Visit
Admission: RE | Admit: 2022-05-22 | Discharge: 2022-05-22 | Disposition: A | Discharge status: Home / Self Care | Payer: BC Managed Care – PPO | Source: Ambulatory Visit | Attending: Internal Medicine | Admitting: Internal Medicine

## 2022-05-22 DIAGNOSIS — N50811 Right testicular pain: Secondary | ICD-10-CM | POA: Diagnosis not present

## 2022-05-22 DIAGNOSIS — N50812 Left testicular pain: Secondary | ICD-10-CM | POA: Diagnosis not present

## 2022-06-29 DIAGNOSIS — N50812 Left testicular pain: Secondary | ICD-10-CM | POA: Diagnosis not present

## 2022-06-29 DIAGNOSIS — M5489 Other dorsalgia: Secondary | ICD-10-CM | POA: Diagnosis not present

## 2022-06-29 DIAGNOSIS — M47817 Spondylosis without myelopathy or radiculopathy, lumbosacral region: Secondary | ICD-10-CM | POA: Diagnosis not present

## 2022-08-11 ENCOUNTER — Ambulatory Visit: Payer: BC Managed Care – PPO | Admitting: Urology

## 2022-08-11 ENCOUNTER — Encounter: Payer: Self-pay | Admitting: Urology

## 2022-08-11 VITALS — BP 125/85 | HR 70 | Ht 70.0 in | Wt 280.0 lb

## 2022-08-11 DIAGNOSIS — N5082 Scrotal pain: Secondary | ICD-10-CM | POA: Diagnosis not present

## 2022-08-11 DIAGNOSIS — M545 Low back pain, unspecified: Secondary | ICD-10-CM

## 2022-08-11 DIAGNOSIS — G8929 Other chronic pain: Secondary | ICD-10-CM | POA: Diagnosis not present

## 2022-08-11 NOTE — Progress Notes (Signed)
I, Jacob Peters,acting as a scribe for Jacob Altes, MD.,have documented all relevant documentation on the behalf of Jacob Altes, MD,as directed by  Jacob Altes, MD while in the presence of Jacob Altes, MD.  08/11/2022 10:28 AM   Jacob Peters 1995-08-28 [id number]  Referring provider: Gracelyn Nurse, MD [address] [place],  [place] [address]  No chief complaint on file.   HPI: Jacob Peters is a 27 y.o. male referred for evaluation of chronic scrotal pain.  In January 2024, he was complaining of left low back pain just to the left of the lumbar spine with the radiation of this pain to the left hemiscrotum. The pain has been persistent since that time though intermittent. He does note worsening when he is sitting for prolonged periods and will have the back pain radiating to the left hemiscrotum.  A scrotal sonogram performed 05/22/22 showed normal appearing testes and a small right spermatocele. His only change in voiding pattern is some increased nocturia at 1-2 No perineal or pelvic pain No gross hematuria.   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 If you have any questions, ask your Peters or doctor.          escitalopram 20 MG tablet Commonly known as: LEXAPRO Take 1 tablet (20 mg total) by mouth daily.   propranolol 20 MG tablet Commonly known as: INDERAL Take 1 tablet (20 mg total) by  mouth 3 (three) times daily.        Allergies: No Known Allergies  Family History: Family History  Problem Relation Age of Onset   Anxiety disorder Mother    Hypertension Father    Diabetes Father    Anxiety disorder Sister    Heart disease Paternal Uncle    Atrial fibrillation Maternal Grandmother    Alcohol abuse Maternal Grandmother    Heart disease Paternal Grandfather     Social History:  reports that he has been smoking cigarettes. He has a 2.50 pack-year smoking history. His smokeless tobacco use includes chew. He reports that he does not currently use alcohol after a past usage of about 30.0 standard drinks of alcohol per week. He reports that he does not use drugs.   Physical Exam: BP 125/85   Pulse 70   Ht 5\' 10"  (1.778 m)   Wt 280 lb (127 kg)   BMI 40.18 kg/m   Constitutional:  Alert and oriented, No acute distress. HEENT: [place] AT, moist mucus membranes.  Trachea midline, no masses. Cardiovascular: No clubbing, cyanosis, or edema. Respiratory: Normal respiratory effort, no increased work of breathing. GI: Abdomen is soft, nontender, nondistended, no abdominal masses GU: Phallus without lesions, testes descended bilaterally without masses or tenderness.Spermatic cord/epididymis palpably normal bilaterally. No evidence of hernia. Skin: No rashes, bruises or suspicious lesions. Neurologic: Grossly intact, no  focal deficits, moving all 4 extremities. Psychiatric: Normal mood and affect.   Pertinent Imaging: Scrotal ultrasound was personally reviewed and interpreted.   Scrotal Ultrasound  EXAM: SCROTAL ULTRASOUND   DOPPLER ULTRASOUND OF THE TESTICLES   TECHNIQUE: Complete ultrasound examination of the testicles, epididymis, and other scrotal structures was performed. Color and spectral Doppler ultrasound were also utilized to evaluate blood flow to the testicles.   COMPARISON:  None Available.   FINDINGS: Right testicle   Measurements: 5.2 x 2.4 x  3.2 cm. No mass or microlithiasis visualized.   Left testicle   Measurements: 4.8 x 2.2 x 3.0 cm. No mass or microlithiasis visualized.   Right epididymis: Tiny incidental epididymal head 5 mm anechoic cyst noted.   Left epididymis:  Normal in size and appearance.   Hydrocele:  None visualized.   Varicocele:  None visualized.   Pulsed Doppler interrogation of both testes demonstrates normal low resistance arterial and venous waveforms bilaterally.   IMPRESSION: No acute or significant finding by scrotal ultrasound.     Electronically Signed   By: Jacob Petit.  Peters M.D.   On: 05/22/2022 11:51    Assessment & Plan:    1. Chronic scrotal content pain Examination and scrotal ultrasound unremarkable.  This is most likely referred pain.  Associated with left low back pain radiating to this area and most likely neuropathic in etiology.  Recommended PCP follow-up for further evaluation of his back pain. They requested special referral and will get a neurosurgery opinion. If a significant disc disease is identified as a cause of referred scrotal pain, they will return for further evaluation of causes of referred pain.  [facility] [address], [address] [place], [place] [address] [phone]

## 2022-08-13 ENCOUNTER — Encounter: Payer: Self-pay | Admitting: Urology

## 2022-08-15 DIAGNOSIS — M545 Low back pain, unspecified: Secondary | ICD-10-CM | POA: Diagnosis not present

## 2022-08-25 ENCOUNTER — Ambulatory Visit: Payer: BC Managed Care – PPO | Admitting: Urology

## 2022-09-05 DIAGNOSIS — S39012D Strain of muscle, fascia and tendon of lower back, subsequent encounter: Secondary | ICD-10-CM | POA: Diagnosis not present

## 2022-09-20 DIAGNOSIS — S39012D Strain of muscle, fascia and tendon of lower back, subsequent encounter: Secondary | ICD-10-CM | POA: Diagnosis not present

## 2022-09-22 DIAGNOSIS — M5416 Radiculopathy, lumbar region: Secondary | ICD-10-CM | POA: Diagnosis not present

## 2022-09-22 DIAGNOSIS — S39012D Strain of muscle, fascia and tendon of lower back, subsequent encounter: Secondary | ICD-10-CM | POA: Diagnosis not present

## 2022-10-04 DIAGNOSIS — S39012D Strain of muscle, fascia and tendon of lower back, subsequent encounter: Secondary | ICD-10-CM | POA: Diagnosis not present

## 2022-10-06 DIAGNOSIS — M5416 Radiculopathy, lumbar region: Secondary | ICD-10-CM | POA: Diagnosis not present

## 2022-11-03 DIAGNOSIS — M5416 Radiculopathy, lumbar region: Secondary | ICD-10-CM | POA: Diagnosis not present

## 2022-11-13 ENCOUNTER — Encounter: Payer: Self-pay | Admitting: Nurse Practitioner

## 2022-11-13 ENCOUNTER — Ambulatory Visit (INDEPENDENT_AMBULATORY_CARE_PROVIDER_SITE_OTHER): Payer: BC Managed Care – PPO | Admitting: Nurse Practitioner

## 2022-11-13 VITALS — BP 126/84 | HR 55 | Temp 98.9°F | Ht 70.0 in | Wt 249.2 lb

## 2022-11-13 DIAGNOSIS — R001 Bradycardia, unspecified: Secondary | ICD-10-CM | POA: Insufficient documentation

## 2022-11-13 DIAGNOSIS — G4719 Other hypersomnia: Secondary | ICD-10-CM | POA: Insufficient documentation

## 2022-11-13 DIAGNOSIS — E6609 Other obesity due to excess calories: Secondary | ICD-10-CM

## 2022-11-13 DIAGNOSIS — R0683 Snoring: Secondary | ICD-10-CM | POA: Diagnosis not present

## 2022-11-13 DIAGNOSIS — Z6835 Body mass index (BMI) 35.0-35.9, adult: Secondary | ICD-10-CM

## 2022-11-13 NOTE — Progress Notes (Signed)
@Patient  ID: Jacob Peters, male    DOB: 10/15/1995, 26 y.o.   MRN: [id number]  Chief Complaint  Patient presents with   Consult    Sleep consult.  Gasping for air during night, not sleeping well, fatigue during day, moving a lot during sleep at night x 3 years.  Also c/o bradycardia.    Referring provider: Danelle Berry, PA-C  HPI: 27 year old male, active smoker referred for sleep apnea. Past medical history significant for anxiety, palpitations, obesity.  TEST/EVENTS:   11/13/2022: Today - sleep consult Patient presents today for sleep consult, referred by Danelle Berry, PA. He's had trouble with his sleep for a while now. Started to notice it after he gained a substantial amount of weight and went from 160 lb to 310 lb. He's back down to 249 lb now. Still has trouble with restless sleep. Wakes up often throughout the night. He's startled himself awake before. His girlfriend says that he snores loudly and gasps at night. She's also told him that he twitches a lot when he's sleeping. He has had some issues with drowsy driving before, usually with longer distances. Never fallen asleep while driving. Denies sleep parasomnias/paralysis. He was also curious if it was normal to have lower heart rates at night. His is usually in the 40's resting/sleeping. No associated symptoms of dizziness or lightheadedness when awake. He goes to bed between 1130pm-1230 am. Falls asleep in 30 min to 1 hr. Wakes 2 times a night. Gets up at 7 am. 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 Weight has fluctuated over the last few years. Never had a previous sleep study. No oxygen use. No significant cardiac disease, stroke, or diabetes. He smokes 1/4 a pack a day. Quit drinking alcohol in January 2024. Drinks around 250 mg of caffeine a day. Occasionally takes melatonin to help him sleep, which he does get benefit from.  Lives with his girlfriend. No children. No reported family history.  [place] 3   No Known  Allergies   There is no immunization history on file for this patient.  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   Obesity 09/17/2015   Palpitations     Tobacco History: Social History   Tobacco Use  Smoking Status Every Day   Current packs/day: 0.50   Average packs/day: 0.5 packs/day for 5.0 years (2.5 ttl pk-yrs)   Types: Cigarettes   Passive exposure: Past  Smokeless Tobacco Current   Types: Chew  Tobacco Comments   smoked 1 pack a week and chewed tobacco until Friday   Ready to quit: Not Answered Counseling given: Not Answered Tobacco comments: smoked 1 pack a week and chewed tobacco until Friday   Outpatient Medications Prior to Visit  Medication Sig Dispense Refill   escitalopram (LEXAPRO) 10 MG tablet Take 10 mg by mouth daily.     meloxicam (MOBIC) 7.5 MG tablet Take 7.5 mg by mouth daily.     propranolol (INDERAL) 20 MG tablet Take 1 tablet (20 mg total) by mouth 3 (three) times daily. (Patient taking differently: Take 20 mg by mouth 2 (two) times daily.) 270 tablet 1   escitalopram (LEXAPRO) 20 MG tablet Take 1 tablet (20 mg total) by mouth daily. (Patient not taking: Reported on 11/13/2022) 90 tablet 3  No facility-administered medications prior to visit.     Review of Systems:   Constitutional: No night sweats, fevers, chills, or lassitude. +weight change, fatigue  HEENT: No headaches, difficulty swallowing, tooth/dental problems, or sore throat. No sneezing, itching, ear ache, nasal congestion, or post nasal drip CV:  No chest pain, orthopnea, PND, swelling in lower extremities, anasarca, dizziness, palpitations, syncope Resp: +snoring, witnessed apneas. No shortness of breath with exertion or at rest. No excess mucus or change in color of mucus. No productive  or non-productive. No hemoptysis. No wheezing.  No chest wall deformity GI:  No heartburn, indigestion GU: No nocturia Skin: No rash, lesions, ulcerations MSK:  No joint pain or swelling.   Neuro: No dizziness or lightheadedness.  Psych: No depression. +stable anxiety. Mood stable. +sleep disturbance     Physical Exam:  BP 126/84 (BP Location: Right Arm, Patient Position: Sitting, Cuff Size: Large)   Pulse (!) 55   Temp 98.9 F (37.2 C) (Oral)   Ht 5\' 10"  (1.778 m)   Wt 249 lb 3.2 oz (113 kg)   SpO2 100%   BMI 35.76 kg/m   GEN: Pleasant, interactive, well-appearing; obese; in no acute distress. HEENT:  Normocephalic and atraumatic. PERRLA. Sclera white. Nasal turbinates pink, moist and patent bilaterally. No rhinorrhea present. Oropharynx pink and moist, without exudate or edema. No lesions, ulcerations, or postnasal drip. Mallampati II NECK:  Supple w/ fair ROM. No JVD present. Normal carotid impulses w/o bruits. Thyroid symmetrical with no goiter or nodules palpated. No lymphadenopathy.   CV: RRR, no m/r/g, no peripheral edema. Pulses intact, +2 bilaterally. No cyanosis, pallor or clubbing. PULMONARY:  Unlabored, regular breathing. Clear bilaterally A&P w/o wheezes/rales/rhonchi. No accessory muscle use.  GI: BS present and normoactive. Soft, non-tender to palpation. No organomegaly or masses detected.  MSK: No erythema, warmth or tenderness. Cap refil <2 sec all extrem. No deformities or joint swelling noted.  Neuro: A/Ox3. No focal deficits noted.   Skin: Warm, no lesions or rashe Psych: Normal affect and behavior. Judgement and thought content appropriate.     Lab Results:  CBC    Component Value Date/Time   WBC 8.0 02/17/2021 1109   RBC 5.39 02/17/2021 1109   HGB 16.5 02/17/2021 1109   HGB 15.6 03/22/2015 1228   HCT 47.9 02/17/2021 1109   HCT 44.5 03/22/2015 1228   PLT 302 02/17/2021 1109   PLT 344 03/22/2015 1228   MCV 88.9 02/17/2021 1109   MCV 82 03/22/2015  1228   MCV 87 05/02/2013 0309   MCH 30.6 02/17/2021 1109   MCHC 34.4 02/17/2021 1109   RDW 13.0 02/17/2021 1109   RDW 13.3 03/22/2015 1228   RDW 13.4 05/02/2013 0309   LYMPHSABS 4,312 (H) 02/17/2021 1109   LYMPHSABS 2.7 03/22/2015 1228   MONOABS 0.7 09/11/2017 1249   EOSABS 160 02/17/2021 1109   EOSABS 0.1 03/22/2015 1228   BASOSABS 32 02/17/2021 1109   BASOSABS 0.0 03/22/2015 1228    BMET    Component Value Date/Time   NA 143 02/17/2021 1109   NA 145 (H) 03/22/2015 1228   NA 140 05/02/2013 0309   K 4.3 02/17/2021 1109   K 3.5 05/02/2013 0309   CL 106 02/17/2021 1109   CL 103 05/02/2013 0309   CO2 27 02/17/2021 1109   CO2 28 (H) 05/02/2013 0309   GLUCOSE 89 02/17/2021 1109   GLUCOSE 81 05/02/2013 0309   BUN 12 02/17/2021 1109   BUN 6 03/22/2015 1228   BUN  8 (L) 05/02/2013 0309   CREATININE 0.82 02/17/2021 1109   CALCIUM 9.0 02/17/2021 1109   CALCIUM 8.4 (L) 05/02/2013 0309   GFRNONAA >60 09/11/2017 1249   GFRNONAA >89 03/08/2016 1617   GFRAA >60 09/11/2017 1249   GFRAA >89 03/08/2016 1617    BNP No results found for: "BNP"   Imaging:  No results found.  Administration History     None           No data to display          No results found for: "NITRICOXIDE"      Assessment & Plan:   Excessive daytime sleepiness He has snoring, excessive daytime sleepiness, nocturnal apneic events, restless sleep. BMI 35. Given this,  I am concerned he could have sleep disordered breathing with obstructive sleep apnea. He will need sleep study for further evaluation.    - discussed how weight can impact sleep and risk for sleep disordered breathing - discussed options to assist with weight loss: combination of diet modification, cardiovascular and strength training exercises   - had an extensive discussion regarding the adverse health consequences related to untreated sleep disordered breathing - specifically discussed the risks for hypertension, coronary  artery disease, cardiac dysrhythmias, cerebrovascular disease, and diabetes - lifestyle modification discussed   - discussed how sleep disruption can increase risk of accidents, particularly when driving - safe driving practices were discussed  Patient Instructions  Given your symptoms, I am concerned that you may have sleep disordered breathing with sleep apnea. You will need a sleep study for further evaluation. Someone will contact you to schedule this.   We discussed how untreated sleep apnea puts an individual at risk for cardiac arrhthymias, pulm HTN, DM, stroke and increases their risk for daytime accidents. We also briefly reviewed treatment options including weight loss, side sleeping position, oral appliance, CPAP therapy or referral to ENT for possible surgical options  Use caution when driving and pull over if you become sleepy.  Follow up in 6-8 weeks with Florentina Addison Shaleen Talamantez,NP to go over sleep study results, or sooner, if needed     Loud snoring See above  Bradycardia Discussed that this is not uncommon in young males at rest and while sleeping. He is also on propranolol, which would contribute. No associated symptoms. Advised to monitor and notify PCP if new symptoms develop.   Obesity Continued healthy weight loss encouraged.    I spent 35 minutes of dedicated to the care of this patient on the date of this encounter to include pre-visit review of records, face-to-face time with the patient discussing conditions above, post visit ordering of testing, clinical documentation with the electronic health record, making appropriate referrals as documented, and communicating necessary findings to members of the patients care team.  Noemi Chapel, NP 11/13/2022  Pt aware and understands NP's role.

## 2022-11-13 NOTE — Assessment & Plan Note (Signed)
He has snoring, excessive daytime sleepiness, nocturnal apneic events, restless sleep. BMI 35. Given this,  I am concerned he could have sleep disordered breathing with obstructive sleep apnea. He will need sleep study for further evaluation.    - discussed how weight can impact sleep and risk for sleep disordered breathing - discussed options to assist with weight loss: combination of diet modification, cardiovascular and strength training exercises   - had an extensive discussion regarding the adverse health consequences related to untreated sleep disordered breathing - specifically discussed the risks for hypertension, coronary artery disease, cardiac dysrhythmias, cerebrovascular disease, and diabetes - lifestyle modification discussed   - discussed how sleep disruption can increase risk of accidents, particularly when driving - safe driving practices were discussed  Patient Instructions  Given your symptoms, I am concerned that you may have sleep disordered breathing with sleep apnea. You will need a sleep study for further evaluation. Someone will contact you to schedule this.   We discussed how untreated sleep apnea puts an individual at risk for cardiac arrhthymias, pulm HTN, DM, stroke and increases their risk for daytime accidents. We also briefly reviewed treatment options including weight loss, side sleeping position, oral appliance, CPAP therapy or referral to ENT for possible surgical options  Use caution when driving and pull over if you become sleepy.  Follow up in 6-8 weeks with Jacob Tahlia Deamer,NP to go over sleep study results, or sooner, if needed

## 2022-11-13 NOTE — Assessment & Plan Note (Signed)
Discussed that this is not uncommon in young males at rest and while sleeping. He is also on propranolol, which would contribute. No associated symptoms. Advised to monitor and notify PCP if new symptoms develop.

## 2022-11-13 NOTE — Patient Instructions (Signed)
Given your symptoms, I am concerned that you may have sleep disordered breathing with sleep apnea. You will need a sleep study for further evaluation. Someone will contact you to schedule this.   We discussed how untreated sleep apnea puts an individual at risk for cardiac arrhthymias, pulm HTN, DM, stroke and increases their risk for daytime accidents. We also briefly reviewed treatment options including weight loss, side sleeping position, oral appliance, CPAP therapy or referral to ENT for possible surgical options  Use caution when driving and pull over if you become sleepy.  Follow up in 6-8 weeks with Jacob Piera Downs,NP to go over sleep study results, or sooner, if needed

## 2022-11-13 NOTE — Assessment & Plan Note (Signed)
See above

## 2022-11-13 NOTE — Assessment & Plan Note (Signed)
Continued healthy weight loss encouraged.

## 2022-11-24 DIAGNOSIS — G4730 Sleep apnea, unspecified: Secondary | ICD-10-CM | POA: Diagnosis not present

## 2022-12-06 DIAGNOSIS — M533 Sacrococcygeal disorders, not elsewhere classified: Secondary | ICD-10-CM | POA: Diagnosis not present

## 2022-12-20 ENCOUNTER — Telehealth: Payer: Self-pay | Admitting: Pulmonary Disease

## 2022-12-20 DIAGNOSIS — R0683 Snoring: Secondary | ICD-10-CM | POA: Diagnosis not present

## 2022-12-20 NOTE — Telephone Encounter (Signed)
Call patient  Sleep study result  Date of study: 11/24/2022  Impression: Negative study for significant sleep disordered breathing with AHI of 2.4, spends less than 1 minute with saturations less than 90% Snoring Daytime sleepiness  Recommendation:  If there remains significant concern for sleep disordered breathing, an in lab study may be considered-this is the gold standard to rule out significant sleep disordered breathing  Encourage weight loss measures  Encourage sleep with the head of the bed elevated  Encourage lateral sleep  An oral device may be considered as an option of treatment for significant snoring, this may be contributing to nonrestorative sleep.

## 2022-12-22 DIAGNOSIS — M5416 Radiculopathy, lumbar region: Secondary | ICD-10-CM | POA: Diagnosis not present

## 2022-12-29 NOTE — Telephone Encounter (Signed)
Spoke with patient regarding prior message. Patient stated he does still have daytime sleepiness.I have advised patient to keep his office visit with Dr.Olalere on Monday.  Patient's voice was understanding . Nothing else further needed.

## 2022-12-29 NOTE — Telephone Encounter (Signed)
ATC x1 LVM for patient to call our office back regarding sleep study result's.Will post result's on mychart.

## 2023-01-01 ENCOUNTER — Encounter: Payer: Self-pay | Admitting: Nurse Practitioner

## 2023-01-01 ENCOUNTER — Ambulatory Visit (INDEPENDENT_AMBULATORY_CARE_PROVIDER_SITE_OTHER): Payer: BC Managed Care – PPO | Admitting: Nurse Practitioner

## 2023-01-01 VITALS — BP 110/84 | HR 65 | Temp 98.3°F | Ht 70.0 in | Wt 218.4 lb

## 2023-01-01 DIAGNOSIS — E66811 Obesity, class 1: Secondary | ICD-10-CM | POA: Diagnosis not present

## 2023-01-01 DIAGNOSIS — F5101 Primary insomnia: Secondary | ICD-10-CM

## 2023-01-01 DIAGNOSIS — R0683 Snoring: Secondary | ICD-10-CM | POA: Diagnosis not present

## 2023-01-01 DIAGNOSIS — G4700 Insomnia, unspecified: Secondary | ICD-10-CM | POA: Insufficient documentation

## 2023-01-01 MED ORDER — ZOLPIDEM TARTRATE 5 MG PO TABS: 5.0000 mg | ORAL_TABLET | Freq: Every evening | ORAL | 1 refills | Status: DC | PRN

## 2023-01-01 NOTE — Assessment & Plan Note (Signed)
See above.  Advised to try over-the-counter snore strips or snore guard.

## 2023-01-01 NOTE — Assessment & Plan Note (Signed)
BMI 31.  He has been actively working on weight loss measures.  Down about 80 pounds.  Encouraged continued healthy weight loss.

## 2023-01-01 NOTE — Progress Notes (Signed)
@Patient  ID: Jacob Peters, male    DOB: Feb 12, 1996, 27 y.o.   MRN: [id number]  Chief Complaint  Patient presents with   Follow-up    Review HST 11/24/2022.    Referring provider: Danelle Berry, Cordelia Poche  HPI: 27 year old male, active smoker referred for sleep consult September 2024. Past medical history significant for anxiety, palpitations, obesity.  TEST/EVENTS:  11/24/2022 HST: AHI 2.4/h, SpO2 low 82%  11/13/2022: Ov with Jandel Patriarca NP for sleep consult, referred by Danelle Berry, PA. He's had trouble with his sleep for a while now. Started to notice it after he gained a substantial amount of weight and went from 160 lb to 310 lb. He's back down to 249 lb now. Still has trouble with restless sleep. Wakes up often throughout the night. He's startled himself awake before. His girlfriend says that he snores loudly and gasps at night. She's also told him that he twitches a lot when he's sleeping. He has had some issues with drowsy driving before, usually with longer distances. Never fallen asleep while driving. Denies sleep parasomnias/paralysis. He was also curious if it was normal to have lower heart rates at night. His is usually in the 40's resting/sleeping. No associated symptoms of dizziness or lightheadedness when awake. He goes to bed between 1130pm-1230 am. Falls asleep in 30 min to 1 hr. Wakes 2 times a night. Gets up at 7 am. 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 Weight has fluctuated over the last few years. Never had a previous sleep study. No oxygen use. No significant cardiac disease, stroke, or diabetes. He smokes 1/4 a pack a day. Quit drinking alcohol in January 2024. Drinks around 250 mg of caffeine a day. Occasionally takes melatonin to help him sleep, which he does get benefit from.  Lives with his girlfriend. No children. No reported family history. Epworth 3  01/01/2023: Today - follow up Patient presents today for follow-up to discuss home sleep study results which did not show any  significant sleep disordered breathing. He feels unchanged compared to his last visit.  Has trouble with sleep at night.  Feels like he never sleeps well.  Tends to toss and turn.  Feels fatigued during the day.  Never tried any prescribed sleep medicines before.  Has tried melatonin but did not have much success with it.  No sleep parasomnias/paralysis.  No Known Allergies   There is no immunization history on file for this patient.  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   Obesity 09/17/2015   Palpitations     Tobacco History: Social History   Tobacco Use  Smoking Status Every Day   Current packs/day: 0.50   Average packs/day: 0.5 packs/day for 5.0 years (2.5 ttl pk-yrs)   Types: Cigarettes   Passive exposure: Past  Smokeless Tobacco Current   Types: Chew  Tobacco Comments   smoked 1 pack a week and chewed tobacco until Friday   Ready to quit: Not Answered Counseling given: Not Answered Tobacco comments: smoked 1 pack a week and chewed tobacco until Friday   Outpatient Medications Prior to Visit  Medication Sig Dispense Refill   escitalopram (LEXAPRO) 10 MG tablet Take 10 mg by mouth daily.     meloxicam (MOBIC) 7.5 MG tablet Take 7.5 mg by mouth  daily.     propranolol (INDERAL) 20 MG tablet Take 1 tablet (20 mg total) by mouth 3 (three) times daily. (Patient taking differently: Take 20 mg by mouth 2 (two) times daily.) 270 tablet 1   tiZANidine (ZANAFLEX) 4 MG tablet Take 4 mg by mouth every 6 (six) hours as needed for muscle spasms. (Patient not taking: Reported on 01/01/2023)     No facility-administered medications prior to visit.     Review of Systems:   Constitutional: No night sweats, fevers, chills, or lassitude. +weight change, fatigue  HEENT: No headaches,  difficulty swallowing, tooth/dental problems, or sore throat. No sneezing, itching, ear ache, nasal congestion, or post nasal drip CV:  No chest pain, orthopnea, PND, swelling in lower extremities, anasarca, dizziness, palpitations, syncope Resp: +snoring, witnessed apneas. No shortness of breath with exertion or at rest. No excess mucus or change in color of mucus. No productive or non-productive. No hemoptysis. No wheezing.  No chest wall deformity GI:  No heartburn, indigestion GU: No nocturia Skin: No rash, lesions, ulcerations MSK:  No joint pain or swelling.   Neuro: No dizziness or lightheadedness.  Psych: No depression. +stable anxiety. Mood stable. +sleep disturbance     Physical Exam:  BP 110/84 (BP Location: Right Arm, Patient Position: Sitting, Cuff Size: Large)   Pulse 65   Temp 98.3 F (36.8 C) (Oral)   Ht 5\' 10"  (1.778 m)   Wt 218 lb 6.4 oz (99.1 kg)   SpO2 98%   BMI 31.34 kg/m   GEN: Pleasant, interactive, well-appearing; obese; in no acute distress. HEENT:  Normocephalic and atraumatic. PERRLA. Sclera white. Nasal turbinates pink, moist and patent bilaterally. No rhinorrhea present. Oropharynx pink and moist, without exudate or edema. No lesions, ulcerations, or postnasal drip. Mallampati II NECK:  Supple w/ fair ROM. No JVD present. Normal carotid impulses w/o bruits. Thyroid symmetrical with no goiter or nodules palpated. No lymphadenopathy.   CV: RRR, no m/r/g, no peripheral edema. Pulses intact, +2 bilaterally. No cyanosis, pallor or clubbing. PULMONARY:  Unlabored, regular breathing. Clear bilaterally A&P w/o wheezes/rales/rhonchi. No accessory muscle use.  GI: BS present and normoactive. Soft, non-tender to palpation. No organomegaly or masses detected.  MSK: No erythema, warmth or tenderness. Cap refil <2 sec all extrem. No deformities or joint swelling noted.  Neuro: A/Ox3. No focal deficits noted.   Skin: Warm, no lesions or rashe Psych: Normal affect and  behavior. Judgement and thought content appropriate.     Lab Results:  CBC    Component Value Date/Time   WBC 8.0 02/17/2021 1109   RBC 5.39 02/17/2021 1109   HGB 16.5 02/17/2021 1109   HGB 15.6 03/22/2015 1228   HCT 47.9 02/17/2021 1109   HCT 44.5 03/22/2015 1228   PLT 302 02/17/2021 1109   PLT 344 03/22/2015 1228   MCV 88.9 02/17/2021 1109   MCV 82 03/22/2015 1228   MCV 87 05/02/2013 0309   MCH 30.6 02/17/2021 1109   MCHC 34.4 02/17/2021 1109   RDW 13.0 02/17/2021 1109   RDW 13.3 03/22/2015 1228   RDW 13.4 05/02/2013 0309   LYMPHSABS 4,312 (H) 02/17/2021 1109   LYMPHSABS 2.7 03/22/2015 1228   MONOABS 0.7 09/11/2017 1249   EOSABS 160 02/17/2021 1109   EOSABS 0.1 03/22/2015 1228   BASOSABS 32 02/17/2021 1109   BASOSABS 0.0 03/22/2015 1228    BMET    Component Value Date/Time   NA 143 02/17/2021 1109   NA 145 (H) 03/22/2015 1228   NA  140 05/02/2013 0309   K 4.3 02/17/2021 1109   K 3.5 05/02/2013 0309   CL 106 02/17/2021 1109   CL 103 05/02/2013 0309   CO2 27 02/17/2021 1109   CO2 28 (H) 05/02/2013 0309   GLUCOSE 89 02/17/2021 1109   GLUCOSE 81 05/02/2013 0309   BUN 12 02/17/2021 1109   BUN 6 03/22/2015 1228   BUN 8 (L) 05/02/2013 0309   CREATININE 0.82 02/17/2021 1109   CALCIUM 9.0 02/17/2021 1109   CALCIUM 8.4 (L) 05/02/2013 0309   GFRNONAA >60 09/11/2017 1249   GFRNONAA >89 03/08/2016 1617   GFRAA >60 09/11/2017 1249   GFRAA >89 03/08/2016 1617    BNP No results found for: "BNP"   Imaging:  No results found.  Administration History     None           No data to display          No results found for: "NITRICOXIDE"      Assessment & Plan:   Insomnia No evidence of significant sleep disordered breathing.  Sleep hygiene measures reviewed.  Will start him on pharmacological therapy for management of his insomnia with Ambien.  Side effect profile reviewed.  Medication education provided.  Understands to not drive/operate heavy  machinery after taking.  Patient Instructions  Sleep study did not show any significant sleep disordered breathing.  We reviewed sleep hygiene measures.  You continue to have difficulties with your sleep at night.  We will start you on a medication to help combat this.  Start Ambien 5 mg nightly as needed for sleep.  Do not drive or operate heavy machinery after taking.  Take within 30 minutes of intended sleep onset and with plans to be in the bed for 7 to 8 hours.  Monitor for any changes in mood or sleep habits.  Notify immediately if any of this occurs. Do not take with other sedating medications or alcohol   Follow up in 6-8 weeks with Florentina Addison Sherman Lipuma,NP in virtual clinic on Friday PMs or sooner, if needed    Loud snoring See above.  Advised to try over-the-counter snore strips or snore guard.  Obesity (BMI 30.0-34.9) BMI 31.  He has been actively working on weight loss measures.  Down about 80 pounds.  Encouraged continued healthy weight loss.    I spent 32 minutes of dedicated to the care of this patient on the date of this encounter to include pre-visit review of records, face-to-face time with the patient discussing conditions above, post visit ordering of testing, clinical documentation with the electronic health record, making appropriate referrals as documented, and communicating necessary findings to members of the patients care team.  Noemi Chapel, NP 01/01/2023  Pt aware and understands NP's role.

## 2023-01-01 NOTE — Patient Instructions (Addendum)
Sleep study did not show any significant sleep disordered breathing.  We reviewed sleep hygiene measures.  You continue to have difficulties with your sleep at night.  We will start you on a medication to help combat this.  Start Ambien 5 mg nightly as needed for sleep.  Do not drive or operate heavy machinery after taking.  Take within 30 minutes of intended sleep onset and with plans to be in the bed for 7 to 8 hours.  Monitor for any changes in mood or sleep habits.  Notify immediately if any of this occurs. Do not take with other sedating medications or alcohol   Follow up in 6-8 weeks with Jacob Addison Keilee Denman,NP in virtual clinic on Friday PMs or sooner, if needed

## 2023-01-01 NOTE — Assessment & Plan Note (Signed)
No evidence of significant sleep disordered breathing.  Sleep hygiene measures reviewed.  Will start him on pharmacological therapy for management of his insomnia with Ambien.  Side effect profile reviewed.  Medication education provided.  Understands to not drive/operate heavy machinery after taking.  Patient Instructions  Sleep study did not show any significant sleep disordered breathing.  We reviewed sleep hygiene measures.  You continue to have difficulties with your sleep at night.  We will start you on a medication to help combat this.  Start Ambien 5 mg nightly as needed for sleep.  Do not drive or operate heavy machinery after taking.  Take within 30 minutes of intended sleep onset and with plans to be in the bed for 7 to 8 hours.  Monitor for any changes in mood or sleep habits.  Notify immediately if any of this occurs. Do not take with other sedating medications or alcohol   Follow up in 6-8 weeks with Jacob Addison Davinder Haff,NP in virtual clinic on Friday PMs or sooner, if needed

## 2023-01-03 DIAGNOSIS — M5416 Radiculopathy, lumbar region: Secondary | ICD-10-CM | POA: Diagnosis not present

## 2023-01-03 DIAGNOSIS — M5126 Other intervertebral disc displacement, lumbar region: Secondary | ICD-10-CM | POA: Diagnosis not present

## 2023-01-31 DIAGNOSIS — M5416 Radiculopathy, lumbar region: Secondary | ICD-10-CM | POA: Diagnosis not present

## 2023-02-08 DIAGNOSIS — M5126 Other intervertebral disc displacement, lumbar region: Secondary | ICD-10-CM | POA: Diagnosis not present

## 2023-03-09 ENCOUNTER — Encounter: Payer: Self-pay | Admitting: Nurse Practitioner

## 2023-03-09 ENCOUNTER — Telehealth: Payer: BC Managed Care – PPO | Admitting: Nurse Practitioner

## 2023-03-09 DIAGNOSIS — F5101 Primary insomnia: Secondary | ICD-10-CM

## 2023-03-09 DIAGNOSIS — G4719 Other hypersomnia: Secondary | ICD-10-CM | POA: Diagnosis not present

## 2023-03-09 DIAGNOSIS — G4700 Insomnia, unspecified: Secondary | ICD-10-CM | POA: Diagnosis not present

## 2023-03-09 NOTE — Patient Instructions (Addendum)
 Sleep study did not show any significant sleep disordered breathing.  We reviewed sleep hygiene measures.     Continue Ambien  5 mg nightly as needed for sleep.  Do not drive or operate heavy machinery after taking.  Take within 30 minutes of intended sleep onset and with plans to be in the bed for 7 to 8 hours.  Monitor for any changes in mood or sleep habits.  Notify immediately if any of this occurs. Do not take with other sedating medications or alcohol. See how this works on more of a consistent basis  If you continue to have difficulties, we can consider an in lab study for further evaluation    Follow up in 6-8 weeks with Izetta Zamaya Rapaport,NP in virtual clinic on Friday PMs or sooner, if needed

## 2023-03-09 NOTE — Progress Notes (Signed)
 Patient ID: Jacob Peters, male     DOB: August 03, 1995, 28 y.o.      MRN: [id number]  No chief complaint on file.   Virtual Visit via Video Note  I connected with Jacob Peters on 03/09/23 at  3:30 PM EST by a video enabled telemedicine application and verified that I am speaking with the correct person using two identifiers.  Location: Patient: Home Provider: Office   I discussed the limitations of evaluation and management by telemedicine and the availability of in person appointments. The patient expressed understanding and agreed to proceed.  History of Present Illness: 28 year old male, active smoker followed for insomnia. Past medical history significant for anxiety, palpitations, obesity.   TEST/EVENTS:  11/24/2022 HST: AHI 2.4/h, SpO2 low 82%   11/13/2022: Ov with Imo Cumbie NP for sleep consult, referred by Jacob Cower, PA. He's had trouble with his sleep for a while now. Started to notice it after he gained a substantial amount of weight and went from 160 lb to 310 lb. He's back down to 249 lb now. Still has trouble with restless sleep. Wakes up often throughout the night. He's startled himself awake before. His girlfriend says that he snores loudly and gasps at night. She's also told him that he twitches a lot when he's sleeping. He has had some issues with drowsy driving before, usually with longer distances. Never fallen asleep while driving. Denies sleep parasomnias/paralysis. He was also curious if it was normal to have lower heart rates at night. His is usually in the 40's resting/sleeping. No associated symptoms of dizziness or lightheadedness when awake. He goes to bed between 1130pm-1230 am. Falls asleep in 30 min to 1 hr. Wakes 2 times a night. Gets up at 7 am. 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 Weight has fluctuated over the last few years. Never had a previous sleep study. No oxygen use. No significant cardiac disease, stroke, or diabetes. He smokes 1/4 a pack a day. Quit  drinking alcohol in January 2024. Drinks around 250 mg of caffeine a day. Occasionally takes melatonin to help him sleep, which he does get benefit from.  Lives with his girlfriend. No children. No reported family history. Epworth 3   01/01/2023: OV with Jacob Pelster NP for follow-up to discuss home sleep study results which did not show any significant sleep disordered breathing. He feels unchanged compared to his last visit.  Has trouble with sleep at night.  Feels like he never sleeps well.  Tends to toss and turn.  Feels fatigued during the day.  Never tried any prescribed sleep medicines before.  Has tried melatonin but did not have much success with it.  No sleep parasomnias/paralysis.  03/09/2023: Today - follow up Patient presents today for follow up via virtual visit.  We started him on Ambien  at his last visit for difficulties with refreshing sleep.  He had a sleep study prior to this and did not have any significant sleep disordered breathing.  He has used the Ambien  a few times on the weekends.  Tolerates well.  Does feel like it makes him more refreshed.  He has not used it during the week, which tends to be when he has more trouble with his sleep.  He was worried about feeling more fatigued during the the day and groggy in the morning.  He has not had any issues with morning grogginess in the mornings on the weekends when he used it.  He is willing to try  it on more consistent basis.  Still has some daytime fatigue symptoms.  He is using snore strips, which his girlfriend has told him has helped with his snoring.  He would like to possibly do an in lab study if the Ambien  does not help him feel more rested during the day and he continues to have trouble with his sleep, which is a reasonable option.  No Known Allergies  There is no immunization history on file for this patient. 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 2017   Obesity 09/17/2015   Palpitations     Tobacco History: Social History   Tobacco Use  Smoking Status Every Day   Current packs/day: 0.50   Average packs/day: 0.5 packs/day for 5.0 years (2.5 ttl pk-yrs)   Types: Cigarettes   Passive exposure: Past  Smokeless Tobacco Current   Types: Chew  Tobacco Comments   smoked 1 pack a week and chewed tobacco until Friday   Ready to quit: Not Answered Counseling given: Not Answered Tobacco comments: smoked 1 pack a week and chewed tobacco until Friday   Outpatient Medications Prior to Visit  Medication Sig Dispense Refill   escitalopram  (LEXAPRO ) 10 MG tablet Take 10 mg by mouth daily.     meloxicam  (MOBIC ) 7.5 MG tablet Take 7.5 mg by mouth daily.     propranolol  (INDERAL ) 20 MG tablet Take 1 tablet (20 mg total) by mouth 3 (three) times daily. (Patient taking differently: Take 20 mg by mouth 2 (two) times daily.) 270 tablet 1   tiZANidine (ZANAFLEX) 4 MG tablet Take 4 mg by mouth every 6 (six) hours as needed for muscle spasms. (Patient not taking: Reported on 01/01/2023)     zolpidem  (AMBIEN ) 5 MG tablet Take 1 tablet (5 mg total) by mouth at bedtime as needed for sleep. 30 tablet 1   No facility-administered medications prior to visit.     Review of Systems:   Constitutional: No night sweats, fevers, chills, or lassitude. +weight change, fatigue  HEENT: No headaches, difficulty swallowing, tooth/dental problems, or sore throat. No sneezing, itching, ear ache, nasal congestion, or post nasal drip CV:  No chest pain, orthopnea, PND, swelling in lower extremities, anasarca, dizziness, palpitations, syncope Resp: +snoring (improved). No shortness of breath with exertion or at rest. No excess mucus or change in color of mucus. No productive or non-productive. No hemoptysis. No wheezing.  No chest wall deformity GI:   No heartburn, indigestion GU: No nocturia Skin: No rash, lesions, ulcerations MSK:  No joint pain or swelling.   Neuro: No dizziness or lightheadedness.  Psych: No depression. +stable anxiety. Mood stable. +sleep disturbance   Observations/Objective: Patient is well-developed, well-nourished in no acute distress. A&Ox3. Resting comfortably at home. Unlabored breathing. Speech is clear and coherent with logical content.   Assessment and Plan: Insomnia No evidence of significant sleep disordered breathing. Started on ambien  at previous OV. He has only used it a few times but seems to tolerate well. He will trial on a more consistent basis and reassess response. Understands side effect profile. Encouraged him to work on more consistent sleep schedule. Sleep hygiene measures reviewed.  If he continues to have difficulties, we could consider an in lab sleep study for further evaluation of sleep disorders.   Patient Instructions  Sleep study did not show any significant sleep disordered breathing.  We reviewed sleep hygiene measures.     Continue Ambien  5 mg nightly as needed for sleep.  Do not drive or operate heavy machinery after taking.  Take within 30 minutes of intended sleep onset and with plans to be in the bed for 7 to 8 hours.  Monitor for any changes in mood or sleep habits.  Notify immediately if any of this occurs. Do not take with other sedating medications or alcohol. See how this works on more of a consistent basis  If you continue to have difficulties, we can consider an in lab study for further evaluation    Follow up in 6-8 weeks with Izetta Darrius Montano,NP in virtual clinic on Friday PMs or sooner, if needed   Excessive daytime sleepiness Due to non-refreshing sleep. See above   I discussed the assessment and treatment plan with the patient. The patient was provided an opportunity to ask questions and all were answered. The patient agreed with the plan and demonstrated an  understanding of the instructions.   The patient was advised to call back or seek an in-person evaluation if the symptoms worsen or if the condition fails to improve as anticipated.  I provided 31 minutes of non-face-to-face time during this encounter.   Comer LULLA Rouleau, NP

## 2023-03-09 NOTE — Assessment & Plan Note (Signed)
 Due to non-refreshing sleep. See above

## 2023-03-09 NOTE — Assessment & Plan Note (Signed)
 No evidence of significant sleep disordered breathing. Started on ambien  at previous OV. He has only used it a few times but seems to tolerate well. He will trial on a more consistent basis and reassess response. Understands side effect profile. Encouraged him to work on more consistent sleep schedule. Sleep hygiene measures reviewed.  If he continues to have difficulties, we could consider an in lab sleep study for further evaluation of sleep disorders.   Patient Instructions  Sleep study did not show any significant sleep disordered breathing.  We reviewed sleep hygiene measures.     Continue Ambien  5 mg nightly as needed for sleep.  Do not drive or operate heavy machinery after taking.  Take within 30 minutes of intended sleep onset and with plans to be in the bed for 7 to 8 hours.  Monitor for any changes in mood or sleep habits.  Notify immediately if any of this occurs. Do not take with other sedating medications or alcohol. See how this works on more of a consistent basis  If you continue to have difficulties, we can consider an in lab study for further evaluation    Follow up in 6-8 weeks with Izetta Leelah Hanna,NP in virtual clinic on Friday PMs or sooner, if needed

## 2023-05-12 IMAGING — CR DG NECK SOFT TISSUE
2 series · 2 of 2 positions shown · non-contrast
Comparison: None.

CLINICAL DATA: Neck pain following sneezing, initial encounter

EXAM:
NECK SOFT TISSUES - 1+ VIEW

[neck lat]
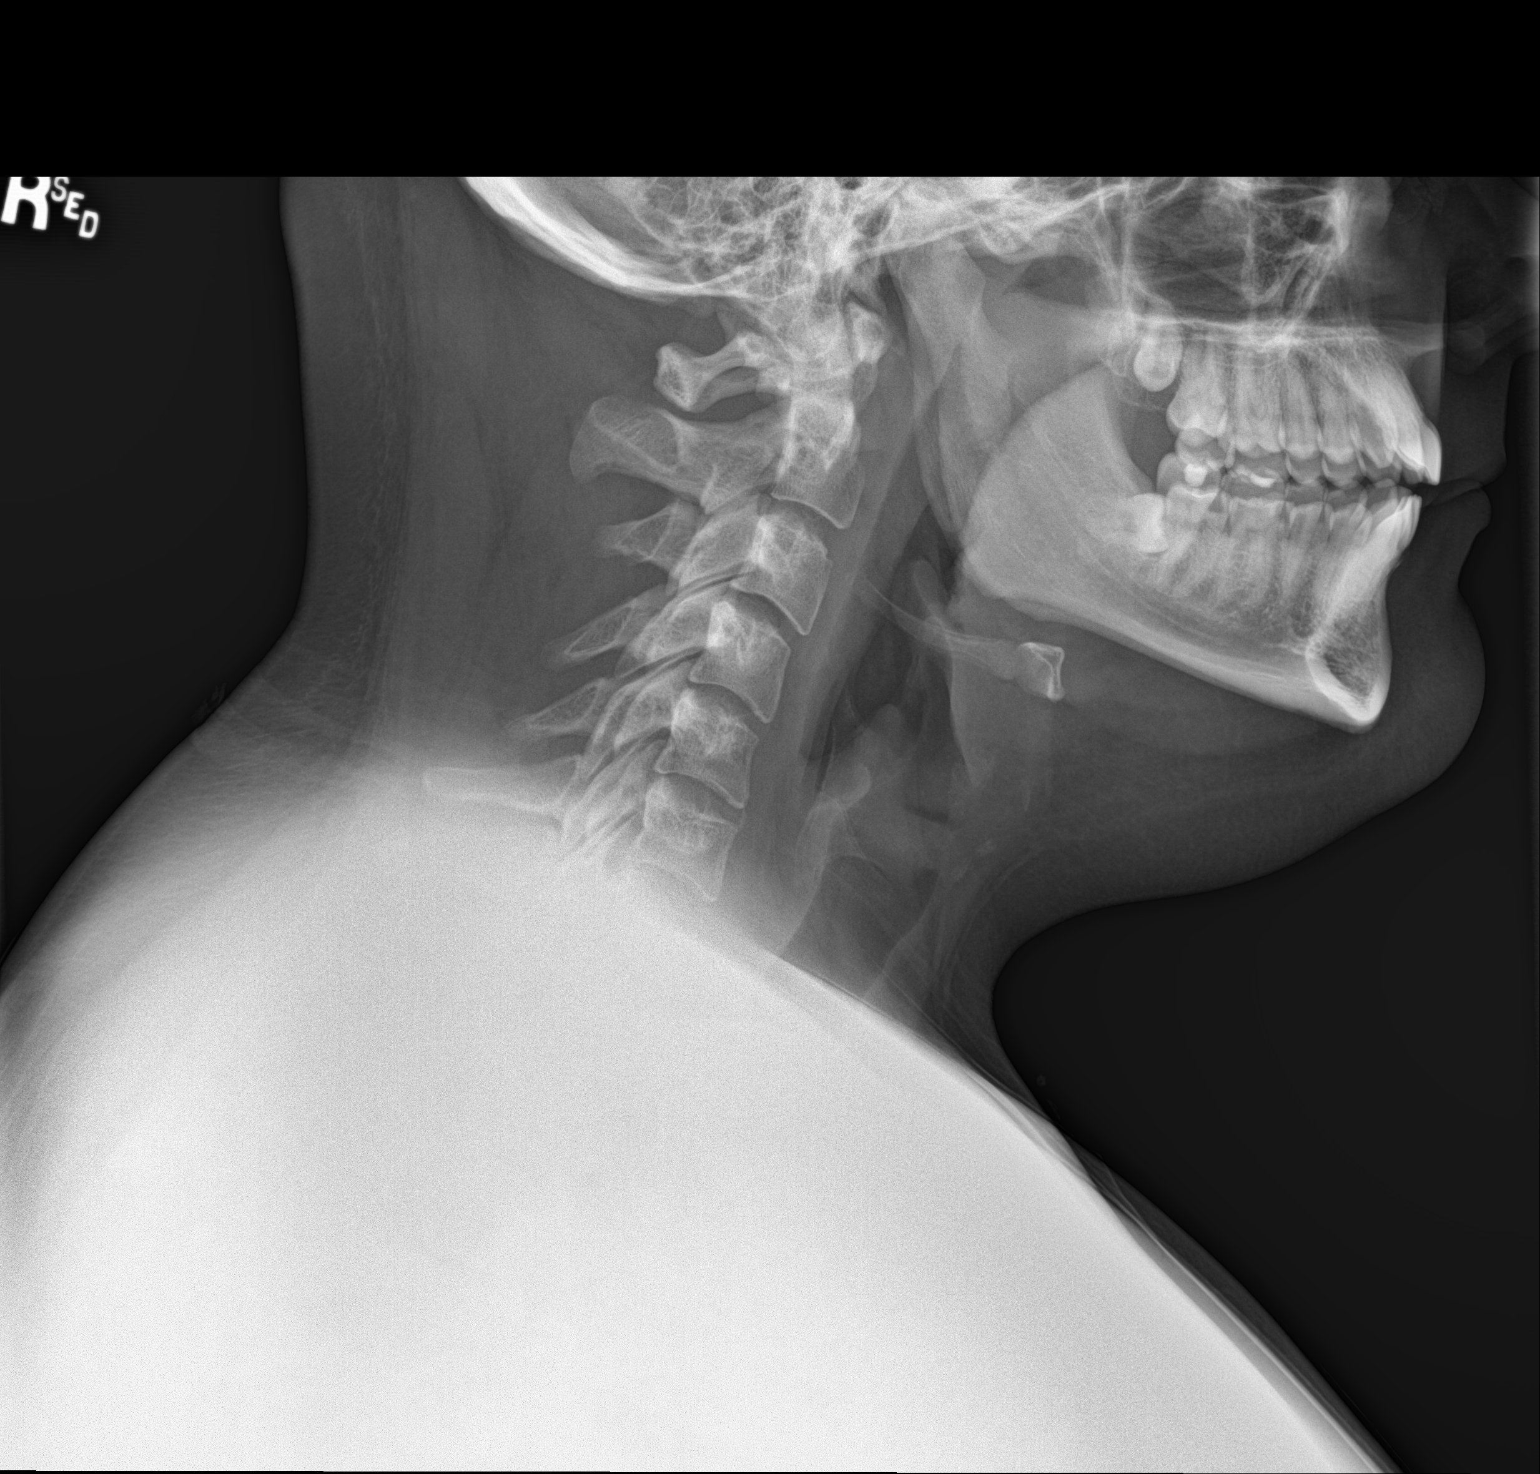

[neck ap]
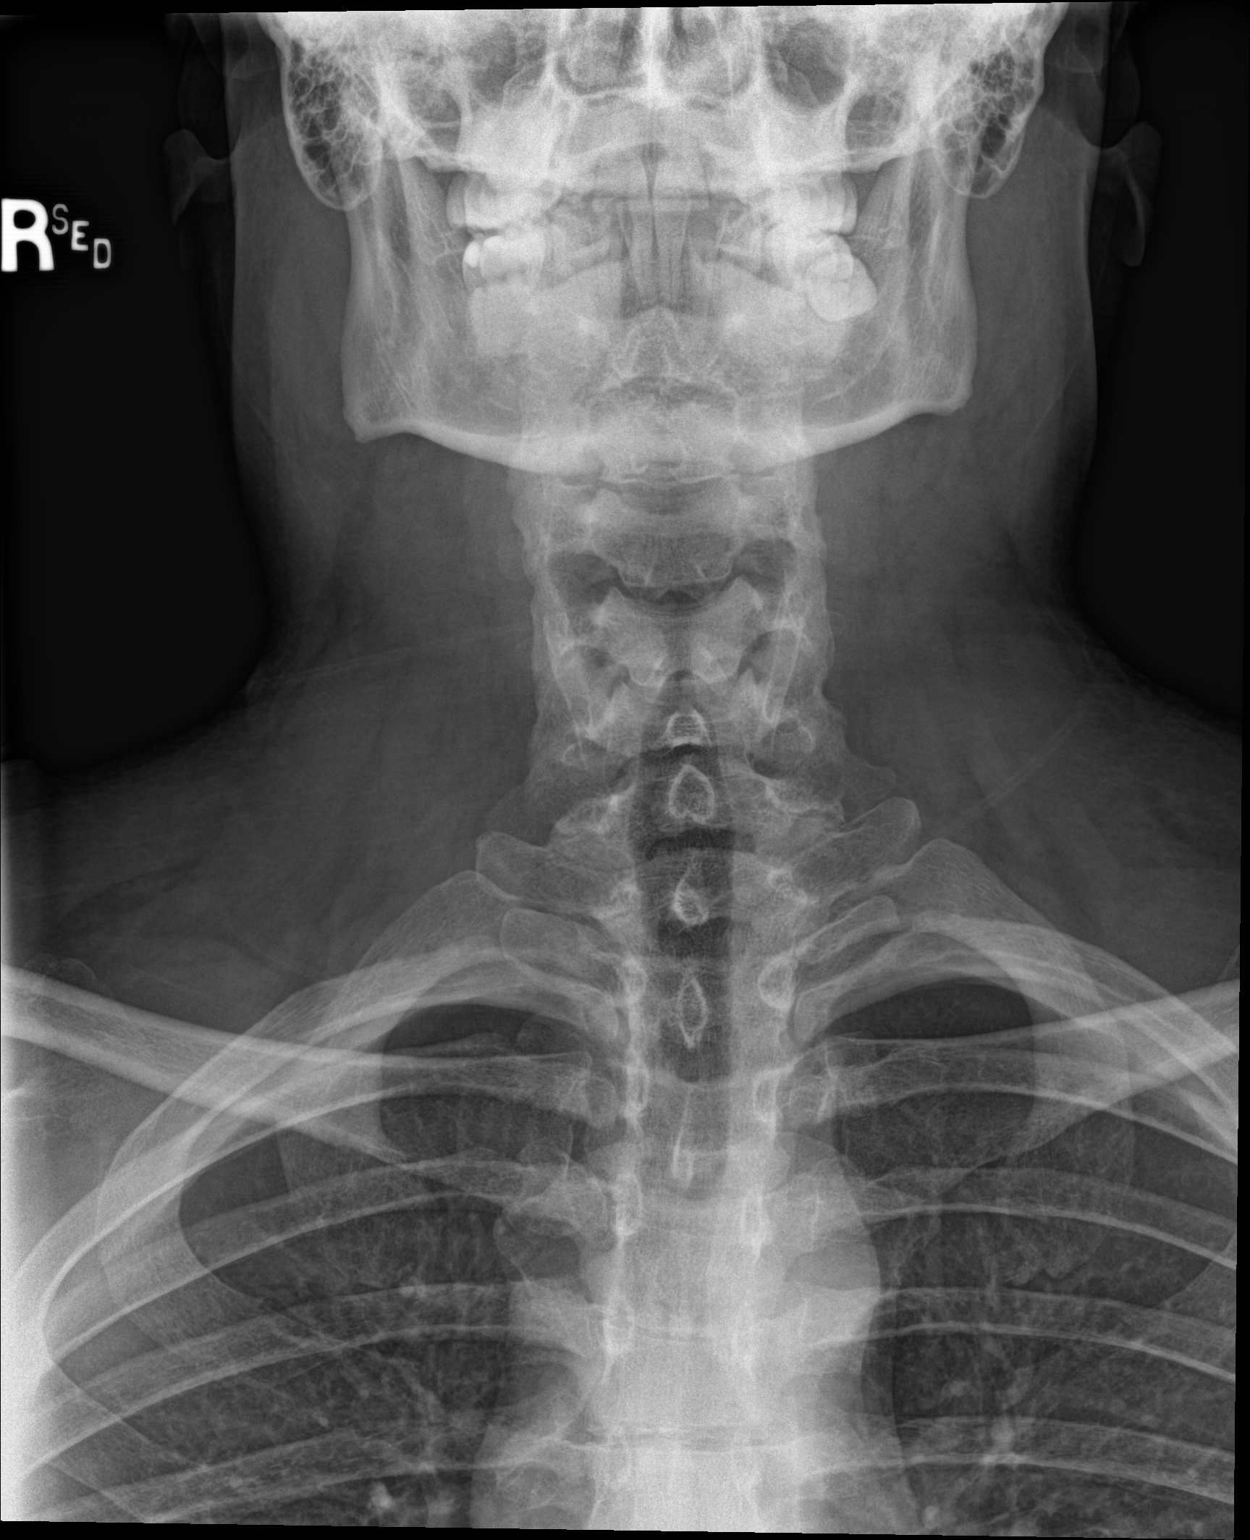

[2 of 2 positions shown; findings below may reference images not displayed]

FINDINGS: There is no evidence of retropharyngeal soft tissue swelling or
epiglottic enlargement. The cervical airway is unremarkable and no
radio-opaque foreign body identified.
IMPRESSION: No acute abnormality noted.

## 2023-05-14 DIAGNOSIS — Z13220 Encounter for screening for lipoid disorders: Secondary | ICD-10-CM | POA: Diagnosis not present

## 2023-05-14 DIAGNOSIS — Z0001 Encounter for general adult medical examination with abnormal findings: Secondary | ICD-10-CM | POA: Diagnosis not present

## 2023-05-14 DIAGNOSIS — Z1389 Encounter for screening for other disorder: Secondary | ICD-10-CM | POA: Diagnosis not present

## 2023-05-21 DIAGNOSIS — J069 Acute upper respiratory infection, unspecified: Secondary | ICD-10-CM | POA: Diagnosis not present

## 2023-05-21 DIAGNOSIS — F17218 Nicotine dependence, cigarettes, with other nicotine-induced disorders: Secondary | ICD-10-CM | POA: Diagnosis not present

## 2023-05-21 DIAGNOSIS — R4589 Other symptoms and signs involving emotional state: Secondary | ICD-10-CM | POA: Diagnosis not present

## 2023-05-21 DIAGNOSIS — Z0001 Encounter for general adult medical examination with abnormal findings: Secondary | ICD-10-CM | POA: Diagnosis not present

## 2023-08-29 DIAGNOSIS — H5213 Myopia, bilateral: Secondary | ICD-10-CM | POA: Diagnosis not present

## 2023-09-04 ENCOUNTER — Other Ambulatory Visit: Payer: Self-pay | Admitting: Nurse Practitioner

## 2023-09-04 DIAGNOSIS — F5101 Primary insomnia: Secondary | ICD-10-CM

## 2023-09-04 NOTE — Telephone Encounter (Signed)
**Note De-identified  [place] Obfuscation** Please advise 

## 2023-09-11 NOTE — Telephone Encounter (Signed)
 I do not see where he has filled the Ambien  (zolpidem ) on his PDMP. Can you call the pharmacy and verify the last fill on this?

## 2023-09-12 NOTE — Telephone Encounter (Signed)
 Nothing is showing on PDMP. Please reach out to pt and find out if he is taking the ambien  and if so, how often. Last fill was 3 months ago.

## 2023-12-21 ENCOUNTER — Encounter: Payer: Self-pay | Admitting: Nurse Practitioner

## 2023-12-21 ENCOUNTER — Ambulatory Visit (INDEPENDENT_AMBULATORY_CARE_PROVIDER_SITE_OTHER): Payer: Self-pay | Admitting: Nurse Practitioner

## 2023-12-21 VITALS — BP 130/84 | HR 55 | Temp 96.2°F | Resp 16 | Ht 70.0 in | Wt 211.2 lb

## 2023-12-21 DIAGNOSIS — Z9229 Personal history of other drug therapy: Secondary | ICD-10-CM

## 2023-12-21 DIAGNOSIS — R002 Palpitations: Secondary | ICD-10-CM | POA: Diagnosis not present

## 2023-12-21 DIAGNOSIS — Z8669 Personal history of other diseases of the nervous system and sense organs: Secondary | ICD-10-CM

## 2023-12-21 DIAGNOSIS — F1320 Sedative, hypnotic or anxiolytic dependence, uncomplicated: Secondary | ICD-10-CM

## 2023-12-21 DIAGNOSIS — F332 Major depressive disorder, recurrent severe without psychotic features: Secondary | ICD-10-CM

## 2023-12-21 DIAGNOSIS — G8929 Other chronic pain: Secondary | ICD-10-CM

## 2023-12-21 DIAGNOSIS — F4001 Agoraphobia with panic disorder: Secondary | ICD-10-CM

## 2023-12-21 DIAGNOSIS — Z8249 Family history of ischemic heart disease and other diseases of the circulatory system: Secondary | ICD-10-CM | POA: Diagnosis not present

## 2023-12-21 DIAGNOSIS — F1120 Opioid dependence, uncomplicated: Secondary | ICD-10-CM

## 2023-12-21 DIAGNOSIS — F1310 Sedative, hypnotic or anxiolytic abuse, uncomplicated: Secondary | ICD-10-CM | POA: Insufficient documentation

## 2023-12-21 DIAGNOSIS — Z833 Family history of diabetes mellitus: Secondary | ICD-10-CM

## 2023-12-21 DIAGNOSIS — F1290 Cannabis use, unspecified, uncomplicated: Secondary | ICD-10-CM

## 2023-12-21 DIAGNOSIS — F411 Generalized anxiety disorder: Secondary | ICD-10-CM

## 2023-12-21 DIAGNOSIS — M545 Low back pain, unspecified: Secondary | ICD-10-CM

## 2023-12-21 DIAGNOSIS — F1021 Alcohol dependence, in remission: Secondary | ICD-10-CM

## 2023-12-21 MED ORDER — GABAPENTIN 300 MG PO CAPS: 300.0000 mg | ORAL_CAPSULE | Freq: Three times a day (TID) | ORAL | 3 refills | Status: AC

## 2023-12-21 NOTE — Progress Notes (Signed)
 [facility] [address] [place], [place] [address]  Internal MEDICINE  Office Visit Note  Patient Name: Jacob Peters  [id number]  [id number]  Date of Service: 12/21/2023   Complaints/HPI Pt is here for establishment of PCP. Chief Complaint  Patient presents with   New Patient (Initial Visit)    Addiction issues to medicine and 7o   Depression    HPI Jacob Peters presents for a new patient visit to establish care.  Well-appearing 28 y.o. male with depression, anxiety, palpitations, insomnia, polysubstance abuse, chronic back pain and history of guillain barre syndrome.  He has struggled with substance use for a long time including alcohol use, in remission, and current opioid and benzodiazepine use. He has previously used fentanyl but this has stopped earlier this year. He wants help with getting off all of these substances. He has been in multiple rehab programs previously and lived in a halfway house for a period of time. His girlfriend is a Theatre stage manager in her final semester prior to graduation and is very supportive of him. He is interested in seeing a psychiatrist and a therapist.  Significant FH: heart disease, MI before age 40 in grandfather and uncle. Type 2 diabetes in father.  Work: IT at [employer]: live at home with girlfriend  Diet: healthy diet, has lost about 100 lbs in the past year.  Exercise: walking at least 10000 steps daily Tobacco use: recently quit smoking cigarettes but still chews tobacco.  Alcohol use: not currently.  Illicit drug use: multiple drugs that he obtains on the street, see details below Labs: due for routine labs and additional labs.  New or worsening pain: low back pain chronic.  History of guillain barre syndrome at age 83 and it was suspected that it was caused by the influenza vaccine. With paralysis from the hips down and weakness of the upper body   Substance Use History:  Marijuana: Yes, daily Cocaine: No Heroin: No Fentanyl:  Yes PCP/LSD: No MDMA (Ecstacy): No Opioids: Yes, oxycodone  Suboxone: Yes, on the street not prescription Benzodiazepines: Yes, valium , xanax and klonopin  Sedatives/hyponotics/barbiturates: No Other: Yes, substance called 7-O Alcohol: Yes, quit drinking earlier this year per patient  Nicotine: Yes, previously smoking cigarettes but has stopped  Kratom: No Kava: No  Current Substance use:  Marijuana: Yes, daily  Cocaine: No Heroin: No Fentanyl: No, quit using fentanyl in the past few months  PCP/LSD: No MDMA: No Opioids: Yes: , still using oxycodone which he obtains illegally from dealers. Wants to quit and is requesting assistance.  Suboxone: Yes, but he gets this on the street, not by prescription. Not willing to go to suboxone clinic. He only takes 1/8 of a sheet.  Benzodiazepines: Yes: most recently using klonopin obtained on the street. Wants to quit and is requesting assistance Sedatives/hypnotics/barbiturates: No Other: Yes Alcohol: No, not currently Nicotine: Yes: chews tobacco, previously stopped smoking cigarettes.  Kratom: No Kava: No        12/21/2023   11:58 AM  Depression screen PHQ 2/9  Decreased Interest 3  Down, Depressed, Hopeless 3  PHQ - 2 Score 6  Altered sleeping 1  Tired, decreased energy 2  Change in appetite 1  Feeling bad or failure about yourself  3  Trouble concentrating 3  Moving slowly or fidgety/restless 1  Suicidal thoughts 0  PHQ-9 Score 17  Difficult doing work/chores Extremely dIfficult        12/21/2023   12:00 PM 01/14/2021  2:22 PM 03/29/2020    8:44 AM  GAD 7 : Generalized Anxiety Score  Nervous, Anxious, on Edge 3 1 0  Control/stop worrying 2 0 0  Worry too much - different things 3 0 0  Trouble relaxing 1 0 0  Restless 1 0 0  Easily annoyed or irritable 2 1 0  Afraid - awful might happen 2 0 0  Total GAD 7 Score 14 2 0  Anxiety Difficulty Extremely difficult Not difficult at all Not difficult at all       Current Medication: Outpatient Encounter Medications as of 12/21/2023  Medication Sig   gabapentin (NEURONTIN) 300 MG capsule Take 1 capsule (300 mg total) by mouth 3 (three) times daily.   escitalopram  (LEXAPRO ) 10 MG tablet Take 10 mg by mouth daily.   meloxicam  (MOBIC ) 7.5 MG tablet Take 7.5 mg by mouth daily.   propranolol  (INDERAL ) 20 MG tablet Take 1 tablet (20 mg total) by mouth 3 (three) times daily. (Patient taking differently: Take 20 mg by mouth 2 (two) times daily.)   tiZANidine (ZANAFLEX) 4 MG tablet Take 4 mg by mouth every 6 (six) hours as needed for muscle spasms. (Patient not taking: Reported on 01/01/2023)   zolpidem  (AMBIEN ) 5 MG tablet Take 1 tablet (5 mg total) by mouth at bedtime as needed for sleep.   No facility-administered encounter medications on file as of 12/21/2023.    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   Benzo, combined with alcohol, during college; 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; in remission July 2017   Obesity 09/17/2015   Palpitations     Family History: Family History  Problem Relation Age of Onset   Anxiety disorder Mother    Hypertension Father    Diabetes Father    Anxiety disorder Sister    Heart disease Paternal Uncle    Atrial fibrillation Maternal Grandmother    Alcohol abuse Maternal Grandmother    Heart disease Paternal Grandfather     Social History   Socioeconomic History   Marital status: Single    Spouse name: Not on file   Number of children: 0   Years of education: Not on file   Highest education level: High school graduate  Occupational History    Comment: part time  Tobacco Use   Smoking status: Former    Current packs/day: 0.50     Average packs/day: 0.5 packs/day for 5.0 years (2.5 ttl pk-yrs)    Types: Cigarettes    Passive exposure: Past   Smokeless tobacco: Current    Types: Chew   Tobacco comments:    smoked 1 pack a week and chewed tobacco until Friday  Vaping Use   Vaping status: Former  Substance and Sexual Activity   Alcohol use: Not Currently    Alcohol/week: 30.0 standard drinks of alcohol    Types: 30 Cans of beer per week   Drug use: Yes    Types: Marijuana, Fentanyl, Oxycodone, Benzodiazepines, Hydrocodone, Morphine   Sexual activity: Yes    Birth control/protection: None, Condom  Other Topics Concern   Not on file  Social History Narrative   Not on file   Social Drivers of Health   Financial Resource Strain:  High Risk (04/02/2017)   Overall Financial Resource Strain (CARDIA)    Difficulty of Paying Living Expenses: Hard  Food Insecurity: No Food Insecurity (04/02/2017)   Hunger Vital Sign    Worried About Running Out of Food in the Last Year: Never true    Ran Out of Food in the Last Year: Never true  Transportation Needs: Unmet Transportation Needs (04/02/2017)   PRAPARE - Administrator, Civil Service (Medical): Yes    Lack of Transportation (Non-Medical): Yes  Physical Activity: Inactive (04/02/2017)   Exercise Vital Sign    Days of Exercise per Week: 0 days    Minutes of Exercise per Session: 0 min  Stress: Stress Concern Present (04/02/2017)   [employer] of Occupational Health - Occupational Stress Questionnaire    Feeling of Stress : Rather much  Social Connections: Somewhat Isolated (04/02/2017)   Social Connection and Isolation Panel    Frequency of Communication with Friends and Family: More than three times a week    Frequency of Social Gatherings with Friends and Family: Twice a week    Attends Religious Services: 1 to 4 times per year    Active Member of [employer] or Organizations: No    Attends Banker Meetings: Never    Marital Status: Never married   Intimate Partner Violence: Not At Risk (04/02/2017)   Humiliation, Afraid, Rape, and Kick questionnaire    Fear of Current or Ex-Partner: No    Emotionally Abused: No    Physically Abused: No    Sexually Abused: No     Review of Systems  Constitutional:  Positive for activity change, appetite change, fatigue and unexpected weight change.  HENT: Negative.  Negative for ear discharge, ear pain, postnasal drip, rhinorrhea, sinus pressure, sinus pain, sore throat and trouble swallowing.   Eyes: Negative.   Respiratory: Negative.  Negative for cough, chest tightness, shortness of breath and wheezing.   Cardiovascular:  Positive for palpitations. Negative for chest pain and leg swelling.  Gastrointestinal: Negative.   Genitourinary: Negative.   Musculoskeletal:  Positive for arthralgias, back pain and myalgias.  Skin: Negative.   Allergic/Immunologic: Negative.   Neurological: Negative.   Psychiatric/Behavioral:  Positive for behavioral problems, decreased concentration, dysphoric mood and sleep disturbance. Negative for self-injury and suicidal ideas. The patient is nervous/anxious.     Vital Signs: BP 130/84   Pulse (!) 55   Temp (!) 96.2 F (35.7 C)   Resp 16   Ht 5' 10 (1.778 m)   Wt 211 lb 3.2 oz (95.8 kg)   SpO2 99%   BMI 30.30 kg/m    Physical Exam Vitals reviewed.  Constitutional:      General: He is not in acute distress.    Appearance: Normal appearance. He is obese. He is not ill-appearing.  HENT:     Head: Normocephalic and atraumatic.  Eyes:     Pupils: Pupils are equal, round, and reactive to light.  Cardiovascular:     Rate and Rhythm: Normal rate and regular rhythm.     Heart sounds: Normal heart sounds. No murmur heard. Pulmonary:     Effort: Pulmonary effort is normal. No respiratory distress.     Breath sounds: Normal breath sounds. No wheezing.  Musculoskeletal:        General: Normal range of motion.  Skin:    General: Skin is warm and dry.   Neurological:     Mental Status: He is alert and oriented to person, place, and  time.  Psychiatric:        Attention and Perception: He is inattentive.        Mood and Affect: Mood is anxious and depressed. Affect is tearful.        Speech: Speech is delayed.        Behavior: Behavior is slowed and withdrawn.        Thought Content: Thought content normal. Thought content is not paranoid or delusional. Thought content does not include homicidal or suicidal ideation.        Cognition and Memory: Cognition and memory normal.        Judgment: Judgment normal. Judgment is not impulsive.       Assessment/Plan: 1. Palpitations (Primary) Noted by patient, occurs with his panic attacks. Has been worked up previously in the ER for cardiac issues but nothing has been found.   2. Chronic midline low back pain without sciatica Chronic back pain reported and patient also reported history of 2 herniated discs but there is no imaging on his record that reflects this. Lumbar spine xray ordered  - DG Lumbar Spine Complete; Future  3. Family history of heart disease in male family member before age 41 Routine and additional labs ordered  - CBC with Differential/Platelet - CMP14+EGFR - Lipid Profile - Hgb A1C w/o eAG - TSH+T4F+T3Free+ThyAbs+TPO+VD25 - B12 and Folate Panel - Vitamin B1  4. Family history of type 2 diabetes mellitus in father Routine and additional labs  - CBC with Differential/Platelet - CMP14+EGFR - Lipid Profile - Hgb A1C w/o eAG - TSH+T4F+T3Free+ThyAbs+TPO+VD25 - B12 and Folate Panel - Vitamin B1  5. 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   6. Severe episode of recurrent major depressive disorder, without psychotic features (HCC) Referred to psychiatry and therapy. Wanting help with quitting substance use and establishing healthy coping skills. Gabapentin  prescribed to limit benzo use and help with mood stabilization. No antidepressant prescribed at this time, defer to psychiatry due to polysubstance use, need assistance with medication management.  - gabapentin (NEURONTIN) 300 MG capsule; Take 1 capsule (300 mg total) by mouth 3 (three) times daily.  Dispense: 90 capsule; Refill: 3 - Ambulatory referral to Psychiatry - Ambulatory referral to Psychology  7. GAD (generalized anxiety disorder) Referred to psychiatry and therapy. Wanting help with quitting substance use and establishing healthy coping skills. Gabapentin prescribed to limit benzo use and help with mood stabilization.  - gabapentin (NEURONTIN) 300 MG capsule; Take 1 capsule (300 mg total) by mouth 3 (three) times daily.  Dispense: 90 capsule; Refill: 3 - Ambulatory referral to Psychiatry - Ambulatory referral to Psychology  8. 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 Wanting help with quitting substance use and establishing healthy coping skills  9. Alcohol use disorder, moderate, in early remission [facility]) Referred to psychiatry and therapy. Wanting help with quitting substance use and establishing healthy coping skills. Routine and additional labs ordered. Gabapentin prescribed to limit benzo use and help with mood stabilization. Also gabapentin may prevent further cravings for alcohol.  - CBC with Differential/Platelet - CMP14+EGFR - Lipid Profile - Hgb A1C w/o eAG - TSH+T4F+T3Free+ThyAbs+TPO+VD25 - B12 and Folate Panel - Vitamin B1  10. Sedative, hypnotic or anxiolytic use disorder, severe, dependence (HCC) Referred to psychiatry and therapy. Wanting help with quitting substance use and establishing healthy coping skills Gabapentin prescribed to limit benzo use and help with mood stabilization.  -  gabapentin (NEURONTIN) 300 MG capsule; Take 1 capsule (300 mg total) by mouth 3 (three) times daily.  Dispense: 90 capsule; Refill:  3 - Ambulatory referral to Psychiatry - Ambulatory referral to Psychology  11. Opioid use disorder, moderate, dependence (HCC) Referred to psychiatry and therapy. Wanting help with quitting substance use and establishing healthy coping skills. Gabapentin prescribed to limit benzo use and help with mood stabilization and to mitigate symptoms of withdrawal as he weans off these substances.  - gabapentin (NEURONTIN) 300 MG capsule; Take 1 capsule (300 mg total) by mouth 3 (three) times daily.  Dispense: 90 capsule; Refill: 3 - Ambulatory referral to Psychiatry - Ambulatory referral to Psychology  12. Marijuana use, continuous Referred to psychiatry and therapy. Wanting help with quitting substance use and establishing healthy coping skills - Ambulatory referral to Psychiatry - Ambulatory referral to Psychology    General Counseling: Lorrene oakland understanding of the findings of todays visit and agrees with plan of treatment. I have discussed any further diagnostic evaluation that may be needed or ordered today. We also reviewed his medications today. he has been encouraged to call the office with any questions or concerns that should arise related to todays visit.    Orders Placed This Encounter  Procedures   CBC with Differential/Platelet   CMP14+EGFR   Lipid Profile   Hgb A1C w/o eAG   TSH+T4F+T3Free+ThyAbs+TPO+VD25   B12 and Folate Panel   Vitamin B1   Ambulatory referral to Psychiatry   Ambulatory referral to Psychology    Meds ordered this encounter  Medications   gabapentin (NEURONTIN) 300 MG capsule    Sig: Take 1 capsule (300 mg total) by mouth 3 (three) times daily.    Dispense:  90 capsule    Refill:  3    Return in about 3 weeks (around 01/11/2024) for CPE, Sherry Rogus PCP and discuss labs and new med as well .  Time spent:30 Minutes Time spent with patient included reviewing progress notes, labs, imaging studies, and discussing plan for follow up.   [place] Controlled  Substance Database was reviewed by me for overdose risk score (ORS)   This patient was seen by Mardy Maxin, FNP-C in collaboration with Dr. Sigrid Bathe as a part of collaborative care agreement.   Aston Lieske R. Maxin, MSN, FNP-C Internal Medicine

## 2023-12-25 ENCOUNTER — Telehealth: Payer: Self-pay | Admitting: Nurse Practitioner

## 2023-12-25 NOTE — Telephone Encounter (Signed)
 Psychology & Psychiatry referral faxed to [facility]; [phone]. Lvm notifying patient. Gave pt telephone [phone]

## 2024-01-04 ENCOUNTER — Telehealth: Payer: Self-pay | Admitting: Nurse Practitioner

## 2024-01-04 NOTE — Telephone Encounter (Signed)
 Left vm to confirm 01/11/24 appointment-Toni

## 2024-01-09 LAB — CMP14+EGFR
ALT: 20 IU/L (ref 0–44)
AST: 23 IU/L (ref 0–40)
Albumin: 4.8 g/dL (ref 4.3–5.2)
Alkaline Phosphatase: 68 IU/L (ref 47–123)
BUN/Creatinine Ratio: 7 — ABNORMAL LOW (ref 9–20)
BUN: 5 mg/dL — ABNORMAL LOW (ref 6–20)
Bilirubin Total: 0.5 mg/dL (ref 0.0–1.2)
CO2: 25 mmol/L (ref 20–29)
Calcium: 9.5 mg/dL (ref 8.7–10.2)
Chloride: 100 mmol/L (ref 96–106)
Creatinine, Ser: 0.76 mg/dL (ref 0.76–1.27)
Globulin, Total: 2.1 g/dL (ref 1.5–4.5)
Glucose: 85 mg/dL (ref 70–99)
Potassium: 4.2 mmol/L (ref 3.5–5.2)
Sodium: 141 mmol/L (ref 134–144)
Total Protein: 6.9 g/dL (ref 6.0–8.5)
eGFR: 125 mL/min/1.73 (ref >59)

## 2024-01-09 LAB — CBC WITH DIFFERENTIAL/PLATELET
Basophils Absolute: 0 x10E3/uL (ref 0.0–0.2)
Basos: 0 %
EOS (ABSOLUTE): 0.2 x10E3/uL (ref 0.0–0.4)
Eos: 3 %
Hematocrit: 43 % (ref 37.5–51.0)
Hemoglobin: 14.4 g/dL (ref 13.0–17.7)
Immature Grans (Abs): 0 x10E3/uL (ref 0.0–0.1)
Immature Granulocytes: 0 %
Lymphocytes Absolute: 3.6 x10E3/uL — ABNORMAL HIGH (ref 0.7–3.1)
Lymphs: 44 %
MCH: 28.8 pg (ref 26.6–33.0)
MCHC: 33.5 g/dL (ref 31.5–35.7)
MCV: 86 fL (ref 79–97)
Monocytes Absolute: 0.6 x10E3/uL (ref 0.1–0.9)
Monocytes: 7 %
Neutrophils Absolute: 3.6 x10E3/uL (ref 1.4–7.0)
Neutrophils: 46 %
Platelets: 314 x10E3/uL (ref 150–450)
RBC: 5 x10E6/uL (ref 4.14–5.80)
RDW: 12.6 % (ref 11.6–15.4)
WBC: 8.1 x10E3/uL (ref 3.4–10.8)

## 2024-01-09 LAB — VITAMIN B1: Thiamine: 103.4 nmol/L (ref 66.5–200.0)

## 2024-01-09 LAB — TSH+T4F+T3FREE+THYABS+TPO+VD25
Free T4: 0.97 ng/dL (ref 0.82–1.77)
T3, Free: 3.3 pg/mL (ref 2.0–4.4)
TSH: 0.883 u[IU]/mL (ref 0.450–4.500)
Thyroglobulin Antibody: <1 [IU]/mL (ref 0.0–0.9)
Thyroperoxidase Ab SerPl-aCnc: <9 [IU]/mL (ref 0–34)
Vit D, 25-Hydroxy: 24 ng/mL — ABNORMAL LOW (ref 30.0–100.0)

## 2024-01-09 LAB — HGB A1C W/O EAG: Hgb A1c MFr Bld: 5.1 % (ref 4.8–5.6)

## 2024-01-09 LAB — B12 AND FOLATE PANEL
Folate: 4.8 ng/mL (ref >3.0)
Vitamin B-12: 752 pg/mL (ref 232–1245)

## 2024-01-09 LAB — LIPID PANEL
Chol/HDL Ratio: 2.7 ratio (ref 0.0–5.0)
Cholesterol, Total: 151 mg/dL (ref 100–199)
HDL: 56 mg/dL (ref >39)
LDL Chol Calc (NIH): 82 mg/dL (ref 0–99)
Triglycerides: 65 mg/dL (ref 0–149)
VLDL Cholesterol Cal: 13 mg/dL (ref 5–40)

## 2024-01-11 ENCOUNTER — Ambulatory Visit: Admitting: Nurse Practitioner

## 2024-01-11 ENCOUNTER — Encounter: Payer: Self-pay | Admitting: Nurse Practitioner

## 2024-01-11 VITALS — BP 114/76 | HR 60 | Temp 95.2°F | Resp 16 | Ht 70.0 in | Wt 220.2 lb

## 2024-01-11 DIAGNOSIS — F332 Major depressive disorder, recurrent severe without psychotic features: Secondary | ICD-10-CM | POA: Diagnosis not present

## 2024-01-11 DIAGNOSIS — E559 Vitamin D deficiency, unspecified: Secondary | ICD-10-CM

## 2024-01-11 DIAGNOSIS — F411 Generalized anxiety disorder: Secondary | ICD-10-CM

## 2024-01-11 DIAGNOSIS — F1320 Sedative, hypnotic or anxiolytic dependence, uncomplicated: Secondary | ICD-10-CM | POA: Diagnosis not present

## 2024-01-11 NOTE — Progress Notes (Signed)
 [facility] [address] [place], [place] [address]  Internal MEDICINE  Office Visit Note  Patient Name: Jacob Peters  [id number]  [id number]  Date of Service: 01/11/2024  Chief Complaint  Patient presents with   Depression   Annual Exam     Theresa presents for a follow-up visit for lab results, substance use, depression and anxiety.  Low vitamin D level at 24.0 Substance abuse -- seeing [facility] addiction medicine, they have him taking suboxone and have him on a tapering schedule for benzodiazepine dependence with valium . He has stopped getting any substances from dealers off the streets. He has a long journey ahead of him but he is hopeful and feels encouraged and motivated by the providers he has been connected with. He is seeing a psychiatrist and starting to see a therapist as well.  He is not due for his annual CPE. It was done earlier this year with his previous PCP. We will schedule this for next year when it is due again.  Labs were reviewed with the patient and most of the labs were normal. He did have a slightly elevated absolute lymphocyte but he does report that he was getting over a cold.     Current Medication: Outpatient Encounter Medications as of 01/11/2024  Medication Sig Note   Buprenorphine HCl-Naloxone HCl 8-2 MG FILM Dissolved 1.5 strips daily under your tongue    diazepam  (VALIUM ) 10 MG tablet Take 10 mg by mouth 3 (three) times daily. 01/11/2024: Prescribed by psychiatry   escitalopram  (LEXAPRO ) 10 MG tablet Take 10 mg by mouth.    gabapentin  (NEURONTIN ) 300 MG capsule Take 1 capsule (300 mg total) by mouth 3 (three) times daily.    meloxicam  (MOBIC ) 7.5 MG tablet Take 7.5 mg by mouth daily. (Patient not taking: Reported on 01/11/2024)    [DISCONTINUED] escitalopram  (LEXAPRO ) 10 MG tablet Take 10 mg by mouth daily.    [DISCONTINUED] propranolol  (INDERAL ) 20 MG tablet Take 1 tablet (20 mg total) by mouth 3 (three) times daily. (Patient taking  differently: Take 20 mg by mouth 2 (two) times daily.)    [DISCONTINUED] tiZANidine (ZANAFLEX) 4 MG tablet Take 4 mg by mouth every 6 (six) hours as needed for muscle spasms. (Patient not taking: Reported on 01/01/2023)    [DISCONTINUED] zolpidem  (AMBIEN ) 5 MG tablet Take 1 tablet (5 mg total) by mouth at bedtime as needed for sleep.    No facility-administered encounter medications on file as of 01/11/2024.    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   Benzo, combined with alcohol, during college; 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; in remission July 2017   Obesity 09/17/2015   Palpitations     Family History: Family History  Problem Relation Age of Onset   Anxiety disorder Mother    Hypertension Father    Diabetes Father    Anxiety disorder Sister    Heart disease Paternal Uncle    Atrial fibrillation Maternal Grandmother    Alcohol abuse Maternal Grandmother    Heart disease Paternal Grandfather     Social History   Socioeconomic History   Marital status: Single    Spouse name: Not on file   Number of children: 0  Years of education: Not on file   Highest education level: High school graduate  Occupational History    Comment: part time  Tobacco Use   Smoking status: Former    Current packs/day: 0.50    Average packs/day: 0.5 packs/day for 5.0 years (2.5 ttl pk-yrs)    Types: Cigarettes    Passive exposure: Past   Smokeless tobacco: Current    Types: Chew   Tobacco comments:    smoked 1 pack a week and chewed tobacco until Friday  Vaping Use   Vaping status: Former  Substance and Sexual Activity   Alcohol use: Not Currently    Alcohol/week: 30.0 standard drinks of alcohol    Types: 30 Cans  of beer per week   Drug use: Yes    Types: Marijuana, Fentanyl, Oxycodone, Benzodiazepines, Hydrocodone, Morphine   Sexual activity: Yes    Birth control/protection: None, Condom  Other Topics Concern   Not on file  Social History Narrative   Not on file   Social Drivers of Health   Financial Resource Strain: High Risk (04/02/2017)   Overall Financial Resource Strain (CARDIA)    Difficulty of Paying Living Expenses: Hard  Food Insecurity: No Food Insecurity (04/02/2017)   Hunger Vital Sign    Worried About Running Out of Food in the Last Year: Never true    Ran Out of Food in the Last Year: Never true  Transportation Needs: Unmet Transportation Needs (04/02/2017)   PRAPARE - Transportation    Lack of Transportation (Medical): Yes    Lack of Transportation (Non-Medical): Yes  Physical Activity: Inactive (04/02/2017)   Exercise Vital Sign    Days of Exercise per Week: 0 days    Minutes of Exercise per Session: 0 min  Stress: Stress Concern Present (04/02/2017)   [employer] of Occupational Health - Occupational Stress Questionnaire    Feeling of Stress : Rather much  Social Connections: Somewhat Isolated (04/02/2017)   Social Connection and Isolation Panel    Frequency of Communication with Friends and Family: More than three times a week    Frequency of Social Gatherings with Friends and Family: Twice a week    Attends Religious Services: 1 to 4 times per year    Active Member of [employer] or Organizations: No    Attends Banker Meetings: Never    Marital Status: Never married  Intimate Partner Violence: Not At Risk (04/02/2017)   Humiliation, Afraid, Rape, and Kick questionnaire    Fear of Current or Ex-Partner: No    Emotionally Abused: No    Physically Abused: No    Sexually Abused: No      Review of Systems  Constitutional:  Positive for activity change, appetite change, fatigue and unexpected weight change.  HENT: Negative.  Negative for ear discharge, ear pain,  postnasal drip, rhinorrhea, sinus pressure, sinus pain, sore throat and trouble swallowing.   Eyes: Negative.   Respiratory: Negative.  Negative for cough, chest tightness, shortness of breath and wheezing.   Cardiovascular:  Positive for palpitations. Negative for chest pain and leg swelling.  Gastrointestinal: Negative.   Genitourinary: Negative.   Musculoskeletal:  Positive for arthralgias, back pain and myalgias.  Skin: Negative.   Allergic/Immunologic: Negative.   Neurological: Negative.   Psychiatric/Behavioral:  Positive for behavioral problems, decreased concentration, depression, dysphoric mood and sleep disturbance. Negative for self-injury and suicidal ideas. The patient is nervous/anxious.     Vital Signs: BP 114/76   Pulse 60   Temp (!) 95.2  F (35.1 C)   Resp 16   Ht 5' 10 (1.778 m)   Wt 220 lb 3.2 oz (99.9 kg)   SpO2 99%   BMI 31.60 kg/m    Physical Exam Vitals reviewed.  Constitutional:      General: He is not in acute distress.    Appearance: Normal appearance. He is obese. He is not ill-appearing.  HENT:     Head: Normocephalic and atraumatic.  Eyes:     Pupils: Pupils are equal, round, and reactive to light.  Cardiovascular:     Rate and Rhythm: Normal rate and regular rhythm.     Heart sounds: Normal heart sounds. No murmur heard. Pulmonary:     Effort: Pulmonary effort is normal. No respiratory distress.     Breath sounds: Normal breath sounds. No wheezing.  Musculoskeletal:        General: Normal range of motion.  Skin:    General: Skin is warm and dry.  Neurological:     Mental Status: He is alert and oriented to person, place, and time.  Psychiatric:        Attention and Perception: He is inattentive.        Mood and Affect: Mood is anxious and depressed. Affect is tearful.        Speech: Speech is delayed.        Behavior: Behavior is slowed and withdrawn.        Thought Content: Thought content normal. Thought content is not paranoid or  delusional. Thought content does not include homicidal or suicidal ideation.        Cognition and Memory: Cognition and memory normal.        Judgment: Judgment normal. Judgment is not impulsive.        Assessment/Plan: 1. Vitamin D deficiency (Primary) Encouraged patient to take OCT vitamin D supplement 2000-5000 units daily.   2. Severe episode of recurrent major depressive disorder, without psychotic features [facility]) Seeing psychiatry and a therapist. Continue with them and continue medications as prescribed by psychiatry.   3. GAD (generalized anxiety disorder) Seeing psychiatry and a therapist. Continue with them and continue medications as prescribed by psychiatry.   4. Sedative, hypnotic or anxiolytic use disorder, severe, dependence (HCC) Seeing [facility] addiction medicine, continue to follow up with them for detoxing from benzodiazepines and and suboxone treatment. Keep up the good work and progress!   General Counseling: Dareion verbalizes understanding of the findings of todays visit and agrees with plan of treatment. I have discussed any further diagnostic evaluation that may be needed or ordered today. We also reviewed his medications today. he has been encouraged to call the office with any questions or concerns that should arise related to todays visit.    No orders of the defined types were placed in this encounter.   No orders of the defined types were placed in this encounter.   Return in about 1 month (around 02/10/2024) for F/U, Shavana Calder PCP.   Total time spent:30 Minutes Time spent includes review of chart, medications, test results, and follow up plan with the patient.   [place] Controlled Substance Database was reviewed by me.  This patient was seen by Mardy Maxin, FNP-C in collaboration with Dr. Sigrid Bathe as a part of collaborative care agreement.   Kashari Chalmers R. Maxin, MSN, FNP-C Internal medicine

## 2024-02-11 ENCOUNTER — Encounter: Payer: Self-pay | Admitting: Nurse Practitioner

## 2024-02-11 ENCOUNTER — Ambulatory Visit: Admitting: Nurse Practitioner

## 2024-02-11 VITALS — BP 120/86 | HR 51 | Temp 96.7°F | Resp 16 | Ht 70.0 in | Wt 224.2 lb

## 2024-02-11 DIAGNOSIS — G8929 Other chronic pain: Secondary | ICD-10-CM

## 2024-02-11 DIAGNOSIS — F332 Major depressive disorder, recurrent severe without psychotic features: Secondary | ICD-10-CM

## 2024-02-11 DIAGNOSIS — F411 Generalized anxiety disorder: Secondary | ICD-10-CM

## 2024-02-11 DIAGNOSIS — M545 Low back pain, unspecified: Secondary | ICD-10-CM

## 2024-02-11 DIAGNOSIS — F1320 Sedative, hypnotic or anxiolytic dependence, uncomplicated: Secondary | ICD-10-CM

## 2024-02-11 DIAGNOSIS — E559 Vitamin D deficiency, unspecified: Secondary | ICD-10-CM

## 2024-02-11 MED ORDER — NAPROXEN 500 MG PO TABS: 500.0000 mg | ORAL_TABLET | Freq: Two times a day (BID) | ORAL | 5 refills | Status: AC | PRN

## 2024-02-11 NOTE — Progress Notes (Signed)
 [facility] [address] [place], [place] [address]  Internal MEDICINE  Office Visit Note  Patient Name: Jacob Peters  [id number]  [id number]  Date of Service: 02/11/2024  Chief Complaint  Patient presents with   Depression   PPD Reading    HPI Jacob Peters presents for a follow-up visit for low back pain, low vitamin D, GAD, depression and substance use.  Seeing psychiatrist weekly and planning to see the therapist through their clinic as well. They are working on taper him slowly off benzodiazepines using valium . They have not adjusted his suboxone dose and will leave this alone until he is off benzos.  Low back pain -- takes naproxen  as needed Low vitamin D -- taking supplement.  Taking gabapentin  to help with withdrawal side effects.     Current Medication: Outpatient Encounter Medications as of 02/11/2024  Medication Sig   naproxen  (NAPROSYN ) 500 MG tablet Take 1 tablet (500 mg total) by mouth 2 (two) times daily as needed for moderate pain (pain score 4-6).   escitalopram  (LEXAPRO ) 10 MG tablet Take 10 mg by mouth.   gabapentin  (NEURONTIN ) 300 MG capsule Take 1 capsule (300 mg total) by mouth 3 (three) times daily.   meloxicam  (MOBIC ) 7.5 MG tablet Take 7.5 mg by mouth daily. (Patient not taking: Reported on 01/11/2024)   No facility-administered encounter medications on file as of 02/11/2024.    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   Benzo, combined with alcohol, during college; 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; in remission July 2017   Obesity 09/17/2015   Palpitations     Family History: Family History  Problem Relation Age of Onset   Anxiety disorder Mother    Hypertension  Father    Diabetes Father    Anxiety disorder Sister    Heart disease Paternal Uncle    Atrial fibrillation Maternal Grandmother    Alcohol abuse Maternal Grandmother    Heart disease Paternal Grandfather     Social History   Socioeconomic History   Marital status: Single    Spouse name: Not on file   Number of children: 0   Years of education: Not on file   Highest education level: High school graduate  Occupational History    Comment: part time  Tobacco Use   Smoking status: Former    Current packs/day: 0.50    Average packs/day: 0.5 packs/day for 5.0 years (2.5 ttl pk-yrs)    Types: Cigarettes    Passive exposure: Past   Smokeless tobacco: Current    Types: Chew   Tobacco comments:    smoked 1 pack a week and chewed tobacco until Friday  Vaping Use   Vaping status: Former  Substance and Sexual Activity   Alcohol use: Not Currently    Alcohol/week: 30.0 standard drinks of alcohol    Types: 30 Cans of beer per week   Drug use: Yes    Types: Marijuana, Fentanyl, Oxycodone, Benzodiazepines, Hydrocodone, Morphine   Sexual activity: Yes    Birth control/protection: None, Condom  Other Topics Concern   Not on file  Social History Narrative   Not on file   Social Drivers of Health   Tobacco Use: High Risk (02/11/2024)  Patient History    Smoking Tobacco Use: Former    Smokeless Tobacco Use: Current    Passive Exposure: Past  Programmer, Applications: Not on file  Food Insecurity: Not on file  Transportation Needs: Not on file  Physical Activity: Not on file  Stress: Not on file  Social Connections: Not on file  Intimate Partner Violence: Not on file  Depression (PHQ2-9): High Risk (12/21/2023)   Depression (PHQ2-9)    PHQ-2 Score: 17  Alcohol Screen: Low Risk (12/21/2023)   Alcohol Screen    Last Alcohol Screening Score (AUDIT): 3  Housing: Unknown (05/11/2023)   Received from [facility]   Epic    Unable to Pay for Housing in the Last  Year: Not on file    Number of Times Moved in the Last Year: Not on file    At any time in the past 12 months, were you homeless or living in a shelter (including now)?: No  Utilities: Not on file  Health Literacy: Not on file      Review of Systems  Constitutional:  Positive for activity change, appetite change, fatigue and unexpected weight change.  HENT: Negative.  Negative for ear discharge, ear pain, postnasal drip, rhinorrhea, sinus pressure, sinus pain, sore throat and trouble swallowing.   Eyes: Negative.   Respiratory: Negative.  Negative for cough, chest tightness, shortness of breath and wheezing.   Cardiovascular:  Positive for palpitations. Negative for chest pain and leg swelling.  Gastrointestinal: Negative.   Genitourinary: Negative.   Musculoskeletal:  Positive for arthralgias, back pain and myalgias.  Skin: Negative.   Allergic/Immunologic: Negative.   Neurological: Negative.   Psychiatric/Behavioral:  Positive for behavioral problems, decreased concentration, dysphoric mood and sleep disturbance. Negative for self-injury and suicidal ideas. The patient is nervous/anxious.     Vital Signs: BP 120/86   Pulse (!) 51   Temp (!) 96.7 F (35.9 C)   Resp 16   Ht 5' 10 (1.778 m)   Wt 224 lb 3.2 oz (101.7 kg)   SpO2 98%   BMI 32.17 kg/m    Physical Exam Vitals reviewed.  Constitutional:      General: He is not in acute distress.    Appearance: Normal appearance. He is obese. He is not ill-appearing.  HENT:     Head: Normocephalic and atraumatic.  Eyes:     Pupils: Pupils are equal, round, and reactive to light.  Cardiovascular:     Rate and Rhythm: Normal rate and regular rhythm.     Heart sounds: Normal heart sounds. No murmur heard. Pulmonary:     Effort: Pulmonary effort is normal. No respiratory distress.     Breath sounds: Normal breath sounds. No wheezing.  Musculoskeletal:        General: Normal range of motion.  Skin:    General: Skin is warm  and dry.  Neurological:     Mental Status: He is alert and oriented to person, place, and time.  Psychiatric:        Attention and Perception: He is inattentive.        Mood and Affect: Affect normal. Mood is anxious.        Speech: Speech normal.        Behavior: Behavior is cooperative.        Thought Content: Thought content normal. Thought content is not paranoid or delusional. Thought content does not include homicidal or suicidal ideation.        Cognition and  Memory: Cognition and memory normal.        Judgment: Judgment normal. Judgment is not impulsive.        Assessment/Plan: 1. Chronic midline low back pain without sciatica (Primary) Take naproxen  as prescribed.  - naproxen  (NAPROSYN ) 500 MG tablet; Take 1 tablet (500 mg total) by mouth 2 (two) times daily as needed for moderate pain (pain score 4-6).  Dispense: 60 tablet; Refill: 5  2. Vitamin D deficiency Continue vitamin D as prescribed.   3. Severe episode of recurrent major depressive disorder, without psychotic features (HCC) Continue lexapro  as prescribed. Continue to follow up with psych as scheduled.  4. GAD (generalized anxiety disorder) Continue lexapro  as prescribed. Continue to follow up with psych as scheduled. Continue to work with specialist to taper benzos  5. Sedative, hypnotic or anxiolytic use disorder, severe, dependence (HCC) Continue suboxone as prescribed by specialist and continue to follow up with them as scheduled. Continue gabapentin  to help with withdrawal symptoms.    General Counseling: Jacob Peters verbalizes understanding of the findings of todays visit and agrees with plan of treatment. I have discussed any further diagnostic evaluation that may be needed or ordered today. We also reviewed his medications today. he has been encouraged to call the office with any questions or concerns that should arise related to todays visit.    No orders of the defined types were placed in this  encounter.   Meds ordered this encounter  Medications   naproxen  (NAPROSYN ) 500 MG tablet    Sig: Take 1 tablet (500 mg total) by mouth 2 (two) times daily as needed for moderate pain (pain score 4-6).    Dispense:  60 tablet    Refill:  5    Return in about 2 months (around 04/13/2024) for F/U, Dinia Joynt PCP.   Total time spent:30 Minutes Time spent includes review of chart, medications, test results, and follow up plan with the patient.   [place] Controlled Substance Database was reviewed by me.  This patient was seen by Mardy Maxin, FNP-C in collaboration with Dr. Sigrid Bathe as a part of collaborative care agreement.   Kenyana Husak R. Maxin, MSN, FNP-C Internal medicine

## 2024-02-19 ENCOUNTER — Encounter: Payer: Self-pay | Admitting: Nurse Practitioner

## 2024-04-14 ENCOUNTER — Ambulatory Visit: Admitting: Nurse Practitioner
